# Patient Record
Sex: Female | Born: 1995 | Race: Black or African American | Hispanic: No | Marital: Single | Smoking: Former smoker
Health system: Southern US, Community
[De-identification: ages and names within clinical notes are randomized; demographics above are authoritative.]

## PROBLEM LIST (undated history)

## (undated) ENCOUNTER — Inpatient Hospital Stay (HOSPITAL_COMMUNITY): Payer: Self-pay

## (undated) DIAGNOSIS — Z789 Other specified health status: Secondary | ICD-10-CM

## (undated) HISTORY — PX: DILATION AND CURETTAGE OF UTERUS: SHX78

---
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
## 2015-02-22 ENCOUNTER — Inpatient Hospital Stay (HOSPITAL_COMMUNITY)
Admission: AD | Admit: 2015-02-22 | Discharge: 2015-02-23 | Disposition: A | Discharge status: Home / Self Care | Payer: BLUE CROSS/BLUE SHIELD | Source: Ambulatory Visit | Attending: Family Medicine | Admitting: Family Medicine

## 2015-02-22 DIAGNOSIS — B9689 Other specified bacterial agents as the cause of diseases classified elsewhere: Secondary | ICD-10-CM

## 2015-02-22 DIAGNOSIS — Z87891 Personal history of nicotine dependence: Secondary | ICD-10-CM | POA: Insufficient documentation

## 2015-02-22 DIAGNOSIS — N760 Acute vaginitis: Secondary | ICD-10-CM | POA: Insufficient documentation

## 2015-02-22 DIAGNOSIS — N898 Other specified noninflammatory disorders of vagina: Secondary | ICD-10-CM | POA: Diagnosis present

## 2015-02-22 LAB — POCT PREGNANCY, URINE: PREG TEST UR: NEGATIVE

## 2015-02-22 NOTE — MAU Note (Signed)
Vaginal discharge x1 week that itches. Has an odor as well. LMP at the end of June. Not sexually active.

## 2015-02-23 ENCOUNTER — Encounter (HOSPITAL_COMMUNITY): Payer: Self-pay

## 2015-02-23 DIAGNOSIS — A499 Bacterial infection, unspecified: Secondary | ICD-10-CM

## 2015-02-23 DIAGNOSIS — N760 Acute vaginitis: Secondary | ICD-10-CM

## 2015-02-23 LAB — URINALYSIS, ROUTINE W REFLEX MICROSCOPIC
Bilirubin Urine: NEGATIVE
GLUCOSE, UA: NEGATIVE mg/dL
HGB URINE DIPSTICK: NEGATIVE
Ketones, ur: NEGATIVE mg/dL
LEUKOCYTES UA: NEGATIVE
Nitrite: NEGATIVE
PH: 7.5 (ref 5.0–8.0)
Protein, ur: NEGATIVE mg/dL
Specific Gravity, Urine: 1.015 (ref 1.005–1.030)
Urobilinogen, UA: 0.2 mg/dL (ref 0.0–1.0)

## 2015-02-23 LAB — WET PREP, GENITAL
TRICH WET PREP: NONE SEEN
Yeast Wet Prep HPF POC: NONE SEEN

## 2015-02-23 MED ORDER — FLUCONAZOLE 150 MG PO TABS: 150.0000 mg | ORAL_TABLET | Freq: Every day | ORAL | Status: DC

## 2015-02-23 MED ORDER — METRONIDAZOLE 500 MG PO TABS: 500.0000 mg | ORAL_TABLET | Freq: Two times a day (BID) | ORAL | Status: DC

## 2015-02-23 NOTE — MAU Provider Note (Signed)
History     CSN: [id number]  Arrival date and time: 02/22/15 2312   First Provider Initiated Contact with Patient 02/23/15 0006      No chief complaint on file.  HPI  Ms. Kristina HutchingShante S Sampson is a 19 y.o. G1P0010 who presents to MAU today with complaint of thin, white, malodorous discharge x 2 days. She denies itching, abdominal pain, fever, UTI symptoms or N/V/D today. She is not currently sexually active.    OB History    Gravida Para Term Preterm AB TAB SAB Ectopic Multiple Living   1 0 0 0 1 1 0 0 0 0       Past Medical History  Diagnosis Date  . Medical history non-contributory     Past Surgical History  Procedure Laterality Date  . Dilation and curettage of uterus      History reviewed. No pertinent family history.  History  Substance Use Topics  . Smoking status: Former Games developermoker  . Smokeless tobacco: Never Used  . Alcohol Use: No    Allergies: No Known Allergies  No prescriptions prior to admission    Review of Systems  Constitutional: Negative for fever and malaise/fatigue.  Gastrointestinal: Negative for nausea, vomiting, abdominal pain, diarrhea and constipation.  Genitourinary: Negative for dysuria, urgency and frequency.       + vaginal discharge Neg - vginal bleeding   Physical Exam   Blood pressure 116/81, pulse 64, temperature 98.2 F (36.8 C), temperature source Oral, resp. rate 18, height 5' (1.524 m), weight 113 lb 9.6 oz (51.529 kg), SpO2 100 %.  Physical Exam  Nursing note and vitals reviewed. Constitutional: She is oriented to person, place, and time. She appears well-developed and well-nourished. No distress.  HENT:  Head: Normocephalic and atraumatic.  Cardiovascular: Normal rate.   Respiratory: Effort normal.  GI: Soft. She exhibits no distension and no mass. There is no tenderness. There is no rebound and no guarding.  Genitourinary: Uterus is not enlarged and not tender. Cervix exhibits no motion tenderness, no discharge and no  friability. Right adnexum displays no mass and no tenderness. Left adnexum displays no mass and no tenderness. No bleeding in the vagina. Vaginal discharge (scant thin, white, malodorous discharge noted) found.  Neurological: She is alert and oriented to person, place, and time.  Skin: Skin is warm and dry. No erythema.  Psychiatric: She has a normal mood and affect.    Results for orders placed or performed during the hospital encounter of 02/22/15 (from the past 24 hour(s))  Urinalysis, Routine w reflex microscopic ([facility])     Status: None   Collection Time: 02/22/15 11:33 PM  Result Value Ref Range   Color, Urine YELLOW YELLOW   APPearance CLEAR CLEAR   Specific Gravity, Urine 1.015 1.005 - 1.030   pH 7.5 5.0 - 8.0   Glucose, UA NEGATIVE NEGATIVE mg/dL   Hgb urine dipstick NEGATIVE NEGATIVE   Bilirubin Urine NEGATIVE NEGATIVE   Ketones, ur NEGATIVE NEGATIVE mg/dL   Protein, ur NEGATIVE NEGATIVE mg/dL   Urobilinogen, UA 0.2 0.0 - 1.0 mg/dL   Nitrite NEGATIVE NEGATIVE   Leukocytes, UA NEGATIVE NEGATIVE  Pregnancy, urine POC     Status: None   Collection Time: 02/22/15 11:52 PM  Result Value Ref Range   Preg Test, Ur NEGATIVE NEGATIVE  Wet prep, genital     Status: Abnormal   Collection Time: 02/23/15 12:07 AM  Result Value Ref Range   Yeast Wet Prep HPF POC NONE SEEN  NONE SEEN   Trich, Wet Prep NONE SEEN NONE SEEN   Clue Cells Wet Prep HPF POC FEW (A) NONE SEEN   WBC, Wet Prep HPF POC FEW (A) NONE SEEN    MAU Course  Procedures None  MDM UPT - negative UA, wet prep, GC/Chlamydia Declines HIV, RPR today  Assessment and Plan  A: Bacterial Vaginosis H/O yeast vulvovaginitis following antibiotic therapy  P: Discharge home Rx for Flagyl and Diflucan given to patient Warning signs for worsening condition discussed Patient advised to follow-up with PCP of choice if symptoms persist Patient may return to MAU as needed or if her condition were to change or  worsen   Marny Lowenstein, PA-C  02/23/2015, 1:57 AM

## 2015-02-23 NOTE — Discharge Instructions (Signed)
Bacterial Vaginosis Bacterial vaginosis is a vaginal infection that occurs when the normal balance of bacteria in the vagina is disrupted. It results from an overgrowth of certain bacteria. This is the most common vaginal infection in women of childbearing age. Treatment is important to prevent complications, especially in pregnant women, as it can cause a premature delivery. CAUSES  Bacterial vaginosis is caused by an increase in harmful bacteria that are normally present in smaller amounts in the vagina. Several different kinds of bacteria can cause bacterial vaginosis. However, the reason that the condition develops is not fully understood. RISK FACTORS Certain activities or behaviors can put you at an increased risk of developing bacterial vaginosis, including:  Having a new sex partner or multiple sex partners.  Douching.  Using an intrauterine device (IUD) for contraception. Women do not get bacterial vaginosis from toilet seats, bedding, swimming pools, or contact with objects around them. SIGNS AND SYMPTOMS  Some women with bacterial vaginosis have no signs or symptoms. Common symptoms include:  Grey vaginal discharge.  A fishlike odor with discharge, especially after sexual intercourse.  Itching or burning of the vagina and vulva.  Burning or pain with urination. DIAGNOSIS  Your health care provider will take a medical history and examine the vagina for signs of bacterial vaginosis. A sample of vaginal fluid may be taken. Your health care provider will look at this sample under a microscope to check for bacteria and abnormal cells. A vaginal pH test may also be done.  TREATMENT  Bacterial vaginosis may be treated with antibiotic medicines. These may be given in the form of a pill or a vaginal cream. A second round of antibiotics may be prescribed if the condition comes back after treatment.  HOME CARE INSTRUCTIONS   Only take over-the-counter or prescription medicines as  directed by your health care provider.  If antibiotic medicine was prescribed, take it as directed. Make sure you finish it even if you start to feel better.  Do not have sex until treatment is completed.  Tell all sexual partners that you have a vaginal infection. They should see their health care provider and be treated if they have problems, such as a mild rash or itching.  Practice safe sex by using condoms and only having one sex partner. SEEK MEDICAL CARE IF:   Your symptoms are not improving after 3 days of treatment.  You have increased discharge or pain.  You have a fever. MAKE SURE YOU:   Understand these instructions.  Will watch your condition.  Will get help right away if you are not doing well or get worse. FOR MORE INFORMATION  Centers for Disease Control and Prevention, Division of STD Prevention: www.cdc.gov/std American Sexual Health Association (ASHA): www.ashastd.org  Document Released: 07/23/2005 Document Revised: 05/13/2013 Document Reviewed: 03/04/2013 ExitCare Patient Information 2015 [employer]. This information is not intended to replace advice given to you by your health care provider. Make sure you discuss any questions you have with your health care provider.  

## 2015-02-24 LAB — GC/CHLAMYDIA PROBE AMP (~~LOC~~) NOT AT ARMC
Chlamydia: NEGATIVE
NEISSERIA GONORRHEA: NEGATIVE

## 2016-07-19 ENCOUNTER — Inpatient Hospital Stay (HOSPITAL_COMMUNITY)
Admission: AD | Admit: 2016-07-19 | Discharge: 2016-07-19 | Disposition: A | Discharge status: Home / Self Care | Payer: BLUE CROSS/BLUE SHIELD | Source: Ambulatory Visit | Attending: Obstetrics and Gynecology | Admitting: Obstetrics and Gynecology

## 2016-07-19 ENCOUNTER — Encounter (HOSPITAL_COMMUNITY): Payer: Self-pay | Admitting: *Deleted

## 2016-07-19 DIAGNOSIS — R102 Pelvic and perineal pain: Secondary | ICD-10-CM | POA: Diagnosis not present

## 2016-07-19 DIAGNOSIS — O26892 Other specified pregnancy related conditions, second trimester: Secondary | ICD-10-CM

## 2016-07-19 DIAGNOSIS — O26842 Uterine size-date discrepancy, second trimester: Secondary | ICD-10-CM | POA: Insufficient documentation

## 2016-07-19 LAB — WET PREP, GENITAL
Clue Cells Wet Prep HPF POC: NONE SEEN
SPERM: NONE SEEN
TRICH WET PREP: NONE SEEN
YEAST WET PREP: NONE SEEN

## 2016-07-19 LAB — URINALYSIS, ROUTINE W REFLEX MICROSCOPIC
BILIRUBIN URINE: NEGATIVE
Glucose, UA: NEGATIVE mg/dL
Hgb urine dipstick: NEGATIVE
Ketones, ur: NEGATIVE mg/dL
LEUKOCYTES UA: NEGATIVE
NITRITE: NEGATIVE
PH: 8 (ref 5.0–8.0)
Protein, ur: NEGATIVE mg/dL
SPECIFIC GRAVITY, URINE: 1.012 (ref 1.005–1.030)

## 2016-07-19 NOTE — Discharge Instructions (Signed)
Second Trimester of Pregnancy The second trimester is from week 13 through week 28, month 4 through 6. This is often the time in pregnancy that you feel your best. Often times, morning sickness has lessened or quit. You may have more energy, and you may get hungry more often. Your unborn baby (fetus) is growing rapidly. At the end of the sixth month, he or she is about 9 inches long and weighs about 1 pounds. You will likely feel the baby move (quickening) between 18 and 20 weeks of pregnancy. Follow these instructions at home:  Avoid all smoking, herbs, and alcohol. Avoid drugs not approved by your doctor.  Do not use any tobacco products, including cigarettes, chewing tobacco, and electronic cigarettes. If you need help quitting, ask your doctor. You may get counseling or other support to help you quit.  Only take medicine as told by your doctor. Some medicines are safe and some are not during pregnancy.  Exercise only as told by your doctor. Stop exercising if you start having cramps.  Eat regular, healthy meals.  Wear a good support bra if your breasts are tender.  Do not use hot tubs, steam rooms, or saunas.  Wear your seat belt when driving.  Avoid raw meat, uncooked cheese, and liter boxes and soil used by cats.  Take your prenatal vitamins.  Take 1500-2000 milligrams of calcium daily starting at the 20th week of pregnancy until you deliver your baby.  Try taking medicine that helps you poop (stool softener) as needed, and if your doctor approves. Eat more fiber by eating fresh fruit, vegetables, and whole grains. Drink enough fluids to keep your pee (urine) clear or pale yellow.  Take warm water baths (sitz baths) to soothe pain or discomfort caused by hemorrhoids. Use hemorrhoid cream if your doctor approves.  If you have puffy, bulging veins (varicose veins), wear support hose. Raise (elevate) your feet for 15 minutes, 3-4 times a day. Limit salt in your diet.  Avoid heavy  lifting, wear low heals, and sit up straight.  Rest with your legs raised if you have leg cramps or low back pain.  Visit your dentist if you have not gone during your pregnancy. Use a soft toothbrush to brush your teeth. Be gentle when you floss.  You can have sex (intercourse) unless your doctor tells you not to.  Go to your doctor visits. Get help if:  You feel dizzy.  You have mild cramps or pressure in your lower belly (abdomen).  You have a nagging pain in your belly area.  You continue to feel sick to your stomach (nauseous), throw up (vomit), or have watery poop (diarrhea).  You have bad smelling fluid coming from your vagina.  You have pain with peeing (urination). Get help right away if:  You have a fever.  You are leaking fluid from your vagina.  You have spotting or bleeding from your vagina.  You have severe belly cramping or pain.  You lose or gain weight rapidly.  You have trouble catching your breath and have chest pain.  You notice sudden or extreme puffiness (swelling) of your face, hands, ankles, feet, or legs.  You have not felt the baby move in over an hour.  You have severe headaches that do not go away with medicine.  You have vision changes. This information is not intended to replace advice given to you by your health care provider. Make sure you discuss any questions you have with your health care  provider. Document Released: 10/17/2009 Document Revised: 12/29/2015 Document Reviewed: 09/23/2012 Elsevier Interactive Patient Education  2017 [employer] First Trimester of Pregnancy The first trimester of pregnancy is from week 1 until the end of week 12 (months 1 through 3). During this time, your baby will begin to develop inside you. At 6-8 weeks, the eyes and face are formed, and the heartbeat can be seen on ultrasound. At the end of 12 weeks, all the baby's organs are formed. Prenatal care is all the medical care you receive before the  birth of your baby. Make sure you get good prenatal care and follow all of your doctor's instructions. Follow these instructions at home: Medicines  Take medicine only as told by your doctor. Some medicines are safe and some are not during pregnancy.  Take your prenatal vitamins as told by your doctor.  Take medicine that helps you poop (stool softener) as needed if your doctor says it is okay. Diet  Eat regular, healthy meals.  Your doctor will tell you the amount of weight gain that is right for you.  Avoid raw meat and uncooked cheese.  If you feel sick to your stomach (nauseous) or throw up (vomit):  Eat 4 or 5 small meals a day instead of 3 large meals.  Try eating a few soda crackers.  Drink liquids between meals instead of during meals.  If you have a hard time pooping (constipation):  Eat high-fiber foods like fresh vegetables, fruit, and whole grains.  Drink enough fluids to keep your pee (urine) clear or pale yellow. Activity and Exercise  Exercise only as told by your doctor. Stop exercising if you have cramps or pain in your lower belly (abdomen) or low back.  Try to avoid standing for long periods of time. Move your legs often if you must stand in one place for a long time.  Avoid heavy lifting.  Wear low-heeled shoes. Sit and stand up straight.  You can have sex unless your doctor tells you not to. Relief of Pain or Discomfort  Wear a good support bra if your breasts are sore.  Take warm water baths (sitz baths) to soothe pain or discomfort caused by hemorrhoids. Use hemorrhoid cream if your doctor says it is okay.  Rest with your legs raised if you have leg cramps or low back pain.  Wear support hose if you have puffy, bulging veins (varicose veins) in your legs. Raise (elevate) your feet for 15 minutes, 3-4 times a day. Limit salt in your diet. Prenatal Care  Schedule your prenatal visits by the twelfth week of pregnancy.  Write down your  questions. Take them to your prenatal visits.  Keep all your prenatal visits as told by your doctor. Safety  Wear your seat belt at all times when driving.  Make a list of emergency phone numbers. The list should include numbers for family, friends, the hospital, and police and fire departments. General Tips  Ask your doctor for a referral to a local prenatal class. Begin classes no later than at the start of month 6 of your pregnancy.  Ask for help if you need counseling or help with nutrition. Your doctor can give you advice or tell you where to go for help.  Do not use hot tubs, steam rooms, or saunas.  Do not douche or use tampons or scented sanitary pads.  Do not cross your legs for long periods of time.  Avoid litter boxes and soil used by cats.  Avoid  all smoking, herbs, and alcohol. Avoid drugs not approved by your doctor.  Do not use any tobacco products, including cigarettes, chewing tobacco, and electronic cigarettes. If you need help quitting, ask your doctor. You may get counseling or other support to help you quit.  Visit your dentist. At home, brush your teeth with a soft toothbrush. Be gentle when you floss. Get help if:  You are dizzy.  You have mild cramps or pressure in your lower belly.  You have a nagging pain in your belly area.  You continue to feel sick to your stomach, throw up, or have watery poop (diarrhea).  You have a bad smelling fluid coming from your vagina.  You have pain with peeing (urination).  You have increased puffiness (swelling) in your face, hands, legs, or ankles. Get help right away if:  You have a fever.  You are leaking fluid from your vagina.  You have spotting or bleeding from your vagina.  You have very bad belly cramping or pain.  You gain or lose weight rapidly.  You throw up blood. It may look like coffee grounds.  You are around people who have [place] measles, fifth disease, or chickenpox.  You have a very  bad headache.  You have shortness of breath.  You have any kind of trauma, such as from a fall or a car accident. This information is not intended to replace advice given to you by your health care provider. Make sure you discuss any questions you have with your health care provider. Document Released: 01/09/2008 Document Revised: 12/29/2015 Document Reviewed: 06/02/2013 Elsevier Interactive Patient Education  2017 [employer] Prenatal Care Providers [facility]    [facility]  [phone]- [phone]     Phone: [phone]          [facility]                      [facility] [place]  @[place]     Phone: [phone]  Phone: [phone]         [facility]     Phone: [phone]  Phone: [phone]           [facility] [place]                hone: [phone]  Phone: [phone]         [facility] [address]      Phone: [phone]  Phone: [phone]         [facility]                Phone: [phone]  [facility]   [address]Phone:[phone]    [facility] [place]          Phone: [phone] [facility]   Phone: [phone]

## 2016-07-19 NOTE — MAU Note (Signed)
Pt reports having abd cramping on and off x 1 week. Back pain started today. [place] now.

## 2016-07-19 NOTE — MAU Provider Note (Signed)
Chief Complaint: Abdominal Pain   First Provider Initiated Contact with Patient 07/19/16 1740        SUBJECTIVE HPI: Kristina Sampson is a 20 y.o. G2P0010 at 1074w4d by LMP who presents to maternity admissions reporting pelvic cramping for a week. Also has some low back pain. Moved here recently. She denies vaginal bleeding, vaginal itching/burning, urinary symptoms, h/a, dizziness, n/v, or fever/chills.   Abdominal Pain  This is a new problem. The current episode started in the past 7 days. The onset quality is gradual. The problem occurs intermittently. The problem has been waxing and waning. The pain is located in the LLQ and RLQ. The pain is mild. The quality of the pain is cramping. The abdominal pain does not radiate. Pertinent negatives include no constipation, diarrhea, dysuria, fever, hematuria, myalgias, nausea or vomiting. Nothing aggravates the pain. The pain is relieved by nothing. She has tried nothing for the symptoms.   RN Note: Pt reports having abd cramping on and off x 1 week. Back pain started today. [place] in [place] now  Past Medical History:  Diagnosis Date  . Medical history non-contributory    Past Surgical History:  Procedure Laterality Date  . DILATION AND CURETTAGE OF UTERUS     Social History   Social History  . Marital status: Single    Spouse name: N/A  . Number of children: N/A  . Years of education: N/A   Occupational History  . Not on file.   Social History Main Topics  . Smoking status: Former Games developermoker  . Smokeless tobacco: Never Used  . Alcohol use No  . Drug use:     Types: Marijuana  . Sexual activity: Not Currently    Birth control/ protection: None   Other Topics Concern  . Not on file   Social History Narrative  . No narrative on file   No current facility-administered medications on file prior to encounter.    Current Outpatient Prescriptions on File Prior to Encounter  Medication Sig  Dispense Refill  . fluconazole (DIFLUCAN) 150 MG tablet Take 1 tablet (150 mg total) by mouth daily. (Patient not taking: Reported on 07/19/2016) 1 tablet 0  . metroNIDAZOLE (FLAGYL) 500 MG tablet Take 1 tablet (500 mg total) by mouth 2 (two) times daily. (Patient not taking: Reported on 07/19/2016) 14 tablet 0   No Known Allergies  I have reviewed patient's Past Medical Hx, Surgical Hx, Family Hx, Social Hx, medications and allergies.   ROS:  Review of Systems  Constitutional: Negative for fever.  Gastrointestinal: Positive for abdominal pain. Negative for constipation, diarrhea, nausea and vomiting.  Genitourinary: Negative for dysuria and hematuria.  Musculoskeletal: Negative for myalgias.   Review of Systems  Other systems negative   Physical Exam  Physical Exam Patient Vitals for the past 24 hrs:  BP Temp Temp src Pulse Resp Height Weight  07/19/16 1733 98/61 98.8 F (37.1 C) Oral 68 18 5\' 1"  (1.549 m) 115 lb 6.4 oz (52.3 kg)   Constitutional: Well-developed, well-nourished female in no acute distress.  Cardiovascular: normal rate Respiratory: normal effort GI: Abd soft, non-tender. Pos BS x 4 MS: Extremities nontender, no edema, normal ROM Neurologic: Alert and oriented x 4.  GU: Neg CVAT.  PELVIC EXAM: Cervix pink, visually closed, without lesion, scant white creamy discharge, vaginal walls and external genitalia normal  Bimanual exam: Cervix 0/long/high, firm, anterior, neg CMT, uterus nontender, slightly enlarged, smaller than expected for 16 weeks,  approximately 10-12wks size, adnexa without tenderness, enlargement, or mass  HR 152 by doppler   LAB RESULTS Results for orders placed or performed during the hospital encounter of 07/19/16 (from the past 24 hour(s))  Urinalysis, Routine w reflex microscopic     Status: None   Collection Time: 07/19/16  5:36 PM  Result Value Ref Range   Color, Urine YELLOW YELLOW   APPearance CLEAR CLEAR   Specific Gravity, Urine  1.012 1.005 - 1.030   pH 8.0 5.0 - 8.0   Glucose, UA NEGATIVE NEGATIVE mg/dL   Hgb urine dipstick NEGATIVE NEGATIVE   Bilirubin Urine NEGATIVE NEGATIVE   Ketones, ur NEGATIVE NEGATIVE mg/dL   Protein, ur NEGATIVE NEGATIVE mg/dL   Nitrite NEGATIVE NEGATIVE   Leukocytes, UA NEGATIVE NEGATIVE  Wet prep, genital     Status: Abnormal   Collection Time: 07/19/16  5:56 PM  Result Value Ref Range   Yeast Wet Prep HPF POC NONE SEEN NONE SEEN   Trich, Wet Prep NONE SEEN NONE SEEN   Clue Cells Wet Prep HPF POC NONE SEEN NONE SEEN   WBC, Wet Prep HPF POC FEW (A) NONE SEEN   Sperm NONE SEEN        IMAGING No results found.  MAU Management/MDM: Discussed may need US for dating next week, ordered Good FHR audible now Cultures and wet prep done. List of OB providers given  ASSESSMENT Pelvic pain affecting pregnancy in second trimester, antepartum  Size of fetus inconsistent with dates in second trimester - Plan: US OB Limited SIUP at 1885w3d by LMP  PLAN Discharge home US ordered for outpatient status this week to confirm dates List of providers given  Pt stable at time of discharge. Encouraged to return here or to other Urgent Care/ED if she develops worsening of symptoms, increase in pain, fever, or other concerning symptoms.    Wynelle BourgeoisMarie Misti Towle CNM, MSN Certified Nurse-Midwife 07/19/2016  7:06 PM

## 2016-07-20 LAB — GC/CHLAMYDIA PROBE AMP (~~LOC~~) NOT AT ARMC
Chlamydia: NEGATIVE
Neisseria Gonorrhea: NEGATIVE

## 2016-07-23 ENCOUNTER — Telehealth: Payer: Self-pay | Admitting: *Deleted

## 2016-07-23 NOTE — Telephone Encounter (Signed)
Called pt and left message stating that I am calling with non urgent test results. Please call back and leave a message stating whether a detailed message can be left on your voice mail.  Pt needs to be informed that her GC/CT test was negative.

## 2016-07-24 NOTE — Telephone Encounter (Signed)
Called patient and left message that results are normal and if she has further questions to call us back.

## 2016-07-24 NOTE — Telephone Encounter (Signed)
Kristina Sampson called back yesterday afternoon and left a voicemail that it is ok to leave test results on her voicemail, she verified her name, birth date.

## 2016-08-06 NOTE — L&D Delivery Note (Signed)
Operative Delivery Note Patient pushed for less than 30 minutes after she was noted to be C/C/+2.  There was fetal bradycardia nadir 50's   Discussed with patient operative delivery with a vacuum. Verbal consent: obtained from patient. Risks and benefits discussed in detail.  Risks include, but are not limited to the risks of anesthesia, bleeding, infection, damage to maternal tissues, fetal cephalhematoma.  There is also the risk of inability to effect vaginal delivery of the head, or shoulder dystocia that cannot be resolved by established maneuvers, leading to the need for emergency cesarean section.  Head determined to be direct OA. Bladder was empty as foley catheter had just been removed. Epidural was found to be adequate.  Vertex was +3 station.    The Kiwi vacuum was placed.  With maternal effort and 3 pulls,  At 5:27 AM a viable and healthy female was delivered via Vaginal, Vacuum Investment banker, operational(Extractor).  Presentation: vertex; Position: Occiput,, Anterior; Station: +3.houlders and body were easily delivered. Cord was double clamped and cut and the infant handed to the waiting Pediatricians.  Infant was noted to be moving all four extremities and crying vigorously. Cord gases and cord blood was obtained. The placenta delivered spontaneously intact 3 vessels noted.     Uterine atony was present and with IV pitocin, and massage. The second degree perineal laceration was repaired in routine fashion with 2-0 Vicryl. The patient tolerated procedure well.   APGAR: 8, 9; weight  pending   Placenta status: intact  Cord:  3 vessels with the following complications: none  Cord pH: pending  Anesthesia:  Epidural Instruments: Kiwi vacuum Episiotomy: None Lacerations: 2nd degree Suture Repair: 2.0 vicryl Est. Blood Loss (mL):  400 mL  Mom to postpartum.  Baby to Couplet care / Skin to Skin.  Essie HartINN, Caris Cerveny STACIA 01/17/2017, 5:52 AM

## 2016-08-16 LAB — OB RESULTS CONSOLE HEPATITIS B SURFACE ANTIGEN: Hepatitis B Surface Ag: NEGATIVE

## 2016-08-17 LAB — OB RESULTS CONSOLE HIV ANTIBODY (ROUTINE TESTING): HIV: NONREACTIVE

## 2016-08-17 LAB — OB RESULTS CONSOLE RUBELLA ANTIBODY, IGM: RUBELLA: IMMUNE

## 2016-12-03 ENCOUNTER — Inpatient Hospital Stay (HOSPITAL_COMMUNITY)
Admission: AD | Admit: 2016-12-03 | Discharge: 2016-12-04 | Disposition: A | Discharge status: Home / Self Care | Payer: Medicaid Other | Source: Ambulatory Visit | Attending: Obstetrics & Gynecology | Admitting: Obstetrics & Gynecology

## 2016-12-03 ENCOUNTER — Encounter (HOSPITAL_COMMUNITY): Payer: Self-pay | Admitting: *Deleted

## 2016-12-03 DIAGNOSIS — O26893 Other specified pregnancy related conditions, third trimester: Secondary | ICD-10-CM | POA: Diagnosis not present

## 2016-12-03 DIAGNOSIS — Z87891 Personal history of nicotine dependence: Secondary | ICD-10-CM | POA: Diagnosis not present

## 2016-12-03 DIAGNOSIS — Z9889 Other specified postprocedural states: Secondary | ICD-10-CM | POA: Diagnosis not present

## 2016-12-03 DIAGNOSIS — R109 Unspecified abdominal pain: Secondary | ICD-10-CM | POA: Diagnosis present

## 2016-12-03 DIAGNOSIS — M545 Low back pain, unspecified: Secondary | ICD-10-CM

## 2016-12-03 DIAGNOSIS — Z91018 Allergy to other foods: Secondary | ICD-10-CM | POA: Diagnosis not present

## 2016-12-03 DIAGNOSIS — O4703 False labor before 37 completed weeks of gestation, third trimester: Secondary | ICD-10-CM | POA: Diagnosis not present

## 2016-12-03 DIAGNOSIS — Z3A32 32 weeks gestation of pregnancy: Secondary | ICD-10-CM | POA: Insufficient documentation

## 2016-12-03 LAB — URINALYSIS, ROUTINE W REFLEX MICROSCOPIC
Bilirubin Urine: NEGATIVE
GLUCOSE, UA: NEGATIVE mg/dL
HGB URINE DIPSTICK: NEGATIVE
KETONES UR: 5 mg/dL — AB
LEUKOCYTES UA: NEGATIVE
Nitrite: NEGATIVE
PROTEIN: NEGATIVE mg/dL
Specific Gravity, Urine: 1.012 (ref 1.005–1.030)
pH: 5 (ref 5.0–8.0)

## 2016-12-03 MED ORDER — ACETAMINOPHEN 500 MG PO TABS
1000.0000 mg | ORAL_TABLET | Freq: Once | ORAL | Status: AC
  Administered 2016-12-03: 1000 mg via ORAL
  Filled 2016-12-03: qty 2

## 2016-12-03 NOTE — MAU Provider Note (Signed)
History     CSN: [id number]  Arrival date and time: 12/03/16 2217  First Provider Initiated Contact with Patient 12/03/16 2318      Chief Complaint  Patient presents with  . Contractions   HPI LEAHANNA BUSER is a 21 y.o. G2P0010 at [redacted]w[redacted]d who presents with contractions & back pain. Symptoms began today around 2 pm. Reports feeling abdominal pain that was intermittent & occurred every 10 minutes. Pain started while at work & resolved prior to arriving in MAU.  Also reports low back pain that is constant & started at same time. Patient states she works at [employer] stands all day during her shift. Does not wear maternity support belt. Rates pain 4/10. Has not treated. Pain worse with walking, twisting movement, & bending over. Denies vaginal bleeding, LOF, dysuria, n/v/d, constipation. Positive fetal movement.   OB History    Gravida Para Term Preterm AB Living   2 0 0 0 1 0   SAB TAB Ectopic Multiple Live Births   0 1 0 0        Past Medical History:  Diagnosis Date  . Medical history non-contributory     Past Surgical History:  Procedure Laterality Date  . DILATION AND CURETTAGE OF UTERUS      History reviewed. No pertinent family history.  Social History  Substance Use Topics  . Smoking status: Former Smoker    Quit date: 06/04/2016  . Smokeless tobacco: Never Used  . Alcohol use No    Allergies:  Allergies  Allergen Reactions  . Lac Bovis     Milk Other reaction(s): Angioedema Itchy throat  . Other     Kiwi Other reaction(s): Angioedema    Prescriptions Prior to Admission  Medication Sig Dispense Refill Last Dose  . Prenatal Vit-Fe Fumarate-FA (PRENATAL MULTIVITAMIN) TABS tablet Take 1 tablet by mouth daily at 12 noon.   12/02/2016 at Unknown time    Review of Systems  Constitutional: Negative.   Gastrointestinal: Negative.   Genitourinary: Negative.   Musculoskeletal: Positive for back pain.   Physical Exam   Blood pressure (!) 106/59, pulse  75, temperature 98.8 F (37.1 C), temperature source Oral, resp. rate 16, height  (1.575 m), weight 136 lb (61.7 kg), last menstrual period 03/25/2016, SpO2 100 %.  Physical Exam  Nursing note and vitals reviewed. Constitutional: She is oriented to person, place, and time. She appears well-developed and well-nourished. No distress.  HENT:  Head: Normocephalic and atraumatic.  Eyes: Conjunctivae are normal. Right eye exhibits no discharge. Left eye exhibits no discharge. No scleral icterus.  Neck: Normal range of motion.  Cardiovascular: Normal rate, regular rhythm and normal heart sounds.   No murmur heard. Respiratory: Effort normal and breath sounds normal. No respiratory distress. She has no wheezes.  GI: Soft. Bowel sounds are normal. There is no tenderness. There is no CVA tenderness.  Neurological: She is alert and oriented to person, place, and time.  Skin: Skin is warm and dry. She is not diaphoretic.  Psychiatric: She has a normal mood and affect. Her behavior is normal. Judgment and thought content normal.   Dilation: Closed Effacement (%): Thick Cervical Position: Posterior Exam by:: Judeth Horn NP  Fetal Tracing:  Baseline: 135 Variability: moderate Accelerations: 15x15 Decelerations: none  Toco: resolved w/PO hydration MAU Course  Procedures Results for orders placed or performed during the hospital encounter of 12/03/16 (from the past 24 hour(s))  Urinalysis, Routine w reflex microscopic     Status:  Abnormal   Collection Time: 12/03/16 10:28 PM  Result Value Ref Range   Color, Urine YELLOW YELLOW   APPearance HAZY (A) CLEAR   Specific Gravity, Urine 1.012 1.005 - 1.030   pH 5.0 5.0 - 8.0   Glucose, UA NEGATIVE NEGATIVE mg/dL   Hgb urine dipstick NEGATIVE NEGATIVE   Bilirubin Urine NEGATIVE NEGATIVE   Ketones, ur 5 (A) NEGATIVE mg/dL   Protein, ur NEGATIVE NEGATIVE mg/dL   Nitrite NEGATIVE NEGATIVE   Leukocytes, UA NEGATIVE NEGATIVE     MDM Reactive fetal tracing Irr ctx that resolved after PO hydration Cervix closed/thick Tylenol & warm compress to back S/w Dr. Mora Appl. Ok to discharge home with PTL precautions Assessment and Plan  A: 1. Preterm uterine contractions in third trimester, antepartum   2. Low back pain during pregnancy in third trimester    P: Discharge home Rx maternity support belt -- to wear at work Discussed used of tylenol & heat therapy for back pain Discussed reasons to return to MAU & PTL precautions Keep f/u with OB  Judeth Horn 12/03/2016, 11:18 PM

## 2016-12-03 NOTE — MAU Note (Signed)
Pt c/o lower back pain and lower abdominal cramping. She has had this pain before but tonight "it feels different." She does not have the pain now, but states on the way here she was able to sit and rest and that helped. +FM. Pt denies vag bleeding.

## 2016-12-04 DIAGNOSIS — O4703 False labor before 37 completed weeks of gestation, third trimester: Secondary | ICD-10-CM

## 2016-12-04 MED ORDER — COMFORT FIT MATERNITY SUPP SM MISC: 1.0000 [IU] | Freq: Every day | 0 refills | Status: DC | PRN

## 2016-12-04 NOTE — Discharge Instructions (Signed)
Back Pain in Pregnancy °Back pain during pregnancy is common. Back pain may be caused by several factors that are related to changes during your pregnancy. °Follow these instructions at home: °Managing pain, stiffness, and swelling  °· If directed, apply ice for sudden (acute) back pain. °¨ Put ice in a plastic bag. °¨ Place a towel between your skin and the bag. °¨ Leave the ice on for 20 minutes, 2-3 times per day. °· If directed, apply heat to the affected area before you exercise: °¨ Place a towel between your skin and the heat pack or heating pad. °¨ Leave the heat on for 20-30 minutes. °¨ Remove the heat if your skin turns bright red. This is especially important if you are unable to feel pain, heat, or cold. You may have a greater risk of getting burned. °Activity  °· Exercise as told by your health care provider. Exercising is the best way to prevent or manage back pain. °· Listen to your body when lifting. If lifting hurts, ask for help or bend your knees. This uses your leg muscles instead of your back muscles. °· Squat down when picking up something from the floor. Do not bend over. °· Only use bed rest as told by your health care provider. Bed rest should only be used for the most severe episodes of back pain. °Standing, Sitting, and Lying Down  °· Do not stand in one place for long periods of time. °· Use good posture when sitting. Make sure your head rests over your shoulders and is not hanging forward. Use a pillow on your lower back if necessary. °· Try sleeping on your side, preferably the left side, with a pillow or two between your legs. If you are sore after a night's rest, your bed may be too soft. A firm mattress may provide more support for your back during pregnancy. °General instructions  °· Do not wear high heels. °· Eat a healthy diet. Try to gain weight within your health care provider's recommendations. °· Use a maternity girdle, elastic sling, or back brace as told by your health care  provider. °· Take over-the-counter and prescription medicines only as told by your health care provider. °· Keep all follow-up visits as told by your health care provider. This is important. This includes any visits with any specialists, such as a physical therapist. °Contact a health care provider if: °· Your back pain interferes with your daily activities. °· You have increasing pain in other parts of your body. °Get help right away if: °· You develop numbness, tingling, weakness, or problems with the use of your arms or legs. °· You develop severe back pain that is not controlled with medicine. °· You have a sudden change in bowel or bladder control. °· You develop shortness of breath, dizziness, or you faint. °· You develop nausea, vomiting, or sweating. °· You have back pain that is a rhythmic, cramping pain similar to labor pains. Labor pain is usually 1-2 minutes apart, lasts for about 1 minute, and involves a bearing down feeling or pressure in your pelvis. °· You have back pain and your water breaks or you have vaginal bleeding. °· You have back pain or numbness that travels down your leg. °· Your back pain developed after you fell. °· You develop pain on one side of your back. °· You see blood in your urine. °· You develop skin blisters in the area of your back pain. °This information is not intended to replace   advice given to you by your health care provider. Make sure you discuss any questions you have with your health care provider. Document Released: 10/31/2005 Document Revised: 12/29/2015 Document Reviewed: 04/06/2015 Elsevier Interactive Patient Education  2017 [employer]. Preterm Labor and Birth Information The normal length of a pregnancy is 39-41 weeks. Preterm labor is when labor starts before 37 completed weeks of pregnancy. What are the risk factors for preterm labor? Preterm labor is more likely to occur in women who:  Have certain infections during pregnancy such as a bladder  infection, sexually transmitted infection, or infection inside the uterus (chorioamnionitis).  Have a shorter-than-normal cervix.  Have gone into preterm labor before.  Have had surgery on their cervix.  Are younger than age 36 or older than age 37.  Are African American.  Are pregnant with twins or multiple babies (multiple gestation).  Take street drugs or smoke while pregnant.  Do not gain enough weight while pregnant.  Became pregnant shortly after having been pregnant. What are the symptoms of preterm labor? Symptoms of preterm labor include:  Cramps similar to those that can happen during a menstrual period. The cramps may happen with diarrhea.  Pain in the abdomen or lower back.  Regular uterine contractions that may feel like tightening of the abdomen.  A feeling of increased pressure in the pelvis.  Increased watery or bloody mucus discharge from the vagina.  Water breaking (ruptured amniotic sac). Why is it important to recognize signs of preterm labor? It is important to recognize signs of preterm labor because babies who are born prematurely may not be fully developed. This can put them at an increased risk for:  Long-term (chronic) heart and lung problems.  Difficulty immediately after birth with regulating body systems, including blood sugar, body temperature, heart rate, and breathing rate.  Bleeding in the brain.  Cerebral palsy.  Learning difficulties.  Death. These risks are highest for babies who are born before 34 weeks of pregnancy. How is preterm labor treated? Treatment depends on the length of your pregnancy, your condition, and the health of your baby. It may involve:  Having a stitch (suture) placed in your cervix to prevent your cervix from opening too early (cerclage).  Taking or being given medicines, such as:  Hormone medicines. These may be given early in pregnancy to help support the pregnancy.  Medicine to stop  contractions.  Medicines to help mature the babys lungs. These may be prescribed if the risk of delivery is high.  Medicines to prevent your baby from developing cerebral palsy. If the labor happens before 34 weeks of pregnancy, you may need to stay in the hospital. What should I do if I think I am in preterm labor? If you think that you are going into preterm labor, call your health care provider right away. How can I prevent preterm labor in future pregnancies? To increase your chance of having a full-term pregnancy:  Do not use any tobacco products, such as cigarettes, chewing tobacco, and e-cigarettes. If you need help quitting, ask your health care provider.  Do not use street drugs or medicines that have not been prescribed to you during your pregnancy.  Talk with your health care provider before taking any herbal supplements, even if you have been taking them regularly.  Make sure you gain a healthy amount of weight during your pregnancy.  Watch for infection. If you think that you might have an infection, get it checked right away.  Make sure to  tell your health care provider if you have gone into preterm labor before. This information is not intended to replace advice given to you by your health care provider. Make sure you discuss any questions you have with your health care provider. Document Released: 10/13/2003 Document Revised: 01/03/2016 Document Reviewed: 12/14/2015 Elsevier Interactive Patient Education  2017 [employer].

## 2016-12-13 ENCOUNTER — Encounter (HOSPITAL_COMMUNITY): Payer: Self-pay | Admitting: *Deleted

## 2016-12-13 ENCOUNTER — Inpatient Hospital Stay (HOSPITAL_COMMUNITY)
Admission: AD | Admit: 2016-12-13 | Discharge: 2016-12-13 | Disposition: A | Discharge status: Home / Self Care | Payer: Medicaid Other | Source: Ambulatory Visit | Attending: Obstetrics | Admitting: Obstetrics

## 2016-12-13 DIAGNOSIS — Z79899 Other long term (current) drug therapy: Secondary | ICD-10-CM | POA: Diagnosis not present

## 2016-12-13 DIAGNOSIS — O26893 Other specified pregnancy related conditions, third trimester: Secondary | ICD-10-CM | POA: Diagnosis not present

## 2016-12-13 DIAGNOSIS — Z87891 Personal history of nicotine dependence: Secondary | ICD-10-CM | POA: Insufficient documentation

## 2016-12-13 DIAGNOSIS — N9089 Other specified noninflammatory disorders of vulva and perineum: Secondary | ICD-10-CM | POA: Diagnosis not present

## 2016-12-13 DIAGNOSIS — O4703 False labor before 37 completed weeks of gestation, third trimester: Secondary | ICD-10-CM | POA: Diagnosis not present

## 2016-12-13 DIAGNOSIS — Z3A33 33 weeks gestation of pregnancy: Secondary | ICD-10-CM | POA: Insufficient documentation

## 2016-12-13 DIAGNOSIS — O479 False labor, unspecified: Secondary | ICD-10-CM

## 2016-12-13 DIAGNOSIS — R102 Pelvic and perineal pain: Secondary | ICD-10-CM | POA: Insufficient documentation

## 2016-12-13 LAB — WET PREP, GENITAL
Clue Cells Wet Prep HPF POC: NONE SEEN
Sperm: NONE SEEN
Trich, Wet Prep: NONE SEEN
Yeast Wet Prep HPF POC: NONE SEEN

## 2016-12-13 LAB — URINALYSIS, ROUTINE W REFLEX MICROSCOPIC
BILIRUBIN URINE: NEGATIVE
GLUCOSE, UA: NEGATIVE mg/dL
HGB URINE DIPSTICK: NEGATIVE
KETONES UR: NEGATIVE mg/dL
LEUKOCYTES UA: NEGATIVE
NITRITE: NEGATIVE
PH: 6 (ref 5.0–8.0)
Protein, ur: NEGATIVE mg/dL
Specific Gravity, Urine: 1.006 (ref 1.005–1.030)

## 2016-12-13 NOTE — Discharge Instructions (Signed)

## 2016-12-13 NOTE — MAU Provider Note (Signed)
History     CSN: [id number]  Arrival date and time: 12/13/16 2028   First Provider Initiated Contact with Patient 12/13/16 2243      Chief Complaint  Patient presents with  . Pelvic Pain   Kristina Sampson is a 21 y.o. G2P0010 at 645w5d who presents today with vulvar swelling. She states that she had this problem yesterday, but it seems better today. She has pictures from yesterday to show me today. She denies any VB or vaginal discharge. She denies leaking of fluid. She has also noticed some contractions today. She has not timed them. She reports normal fetal movement. Next appointment 12/19/17.    Pelvic Pain  The patient's primary symptoms include pelvic pain. The patient's pertinent negatives include no vaginal discharge. This is a new problem. The current episode started today. The problem occurs intermittently. The problem has been unchanged. Pain severity now: 6/10. The problem affects both sides. She is pregnant. Pertinent negatives include no abdominal pain, chills, dysuria, fever, frequency, nausea, urgency or vomiting. The vaginal discharge was normal. There has been no bleeding. Nothing aggravates the symptoms. She has tried nothing for the symptoms. Sexual activity: No intercourse in the last 24 hours.     Past Medical History:  Diagnosis Date  . Medical history non-contributory     Past Surgical History:  Procedure Laterality Date  . DILATION AND CURETTAGE OF UTERUS      No family history on file.  Social History  Substance Use Topics  . Smoking status: Former Smoker    Quit date: 06/04/2016  . Smokeless tobacco: Never Used  . Alcohol use No    Allergies:  Allergies  Allergen Reactions  . Lac Bovis     Milk Other reaction(s): Angioedema Itchy throat  . Other     Kiwi Other reaction(s): Angioedema    Prescriptions Prior to Admission  Medication Sig Dispense Refill Last Dose  . acetaminophen (TYLENOL) 325 MG tablet Take 650 mg by mouth every 6 (six)  hours as needed for mild pain.   12/13/2016 at Unknown time  . Prenatal Vit-Fe Fumarate-FA (PRENATAL MULTIVITAMIN) TABS tablet Take 1 tablet by mouth daily at 12 noon.   12/13/2016 at Unknown time  . Elastic Bandages & Supports (COMFORT FIT MATERNITY SUPP SM) MISC 1 Units by Does not apply route daily as needed. 1 each 0     Review of Systems  Constitutional: Negative for chills and fever.  Gastrointestinal: Negative for abdominal pain, nausea and vomiting.  Genitourinary: Positive for pelvic pain. Negative for dysuria, frequency, urgency, vaginal bleeding, vaginal discharge and vaginal pain.   Physical Exam   Blood pressure (!) 98/58, pulse 70, temperature 98.1 F (36.7 C), temperature source Oral, resp. rate 18, height 5\' 2"  (1.575 m), weight 139 lb 4 oz (63.2 kg), last menstrual period 03/25/2016.  Physical Exam  Nursing note and vitals reviewed. Constitutional: She is oriented to person, place, and time. She appears well-developed and well-nourished. No distress.  HENT:  Head: Normocephalic.  Cardiovascular: Normal rate.   Respiratory: Effort normal.  GI: Soft. There is no tenderness. There is no rebound.  Genitourinary:  Genitourinary Comments: No vulvar edema noted today. No vulvar varicosities.  Cervix: closed/thick   Neurological: She is alert and oriented to person, place, and time.  Skin: Skin is warm and dry.  Psychiatric: She has a normal mood and affect.   FHT: 135, moderate with 15x15 accels, no decels Toco: Some UI, and about 3-4 contractions in >1 hour.  Results for orders placed or performed during the hospital encounter of 12/13/16 (from the past 24 hour(s))  Urinalysis, Routine w reflex microscopic     Status: Abnormal   Collection Time: 12/13/16  8:48 PM  Result Value Ref Range   Color, Urine STRAW (A) YELLOW   APPearance CLEAR CLEAR   Specific Gravity, Urine 1.006 1.005 - 1.030   pH 6.0 5.0 - 8.0   Glucose, UA NEGATIVE NEGATIVE mg/dL   Hgb urine dipstick  NEGATIVE NEGATIVE   Bilirubin Urine NEGATIVE NEGATIVE   Ketones, ur NEGATIVE NEGATIVE mg/dL   Protein, ur NEGATIVE NEGATIVE mg/dL   Nitrite NEGATIVE NEGATIVE   Leukocytes, UA NEGATIVE NEGATIVE   RBC / HPF 0-5 0 - 5 RBC/hpf   WBC, UA 0-5 0 - 5 WBC/hpf   Bacteria, UA RARE (A) NONE SEEN   Squamous Epithelial / LPF 0-5 (A) NONE SEEN   Mucous PRESENT     MAU Course  Procedures  MDM 2258: D/W Dr. Chestine Spore ok for DC home.   Assessment and Plan   1. Braxton Hicks contractions   2. Vulvar edema   3. [redacted] weeks gestation of pregnancy    DC home Comfort measures reviewed  3rd Trimester precautions  PTL precautions  Fetal kick counts RX: none  Return to MAU as needed FU with OB as planned  Follow-up Information    Levi Aland, MD Follow up.   Specialty:  Obstetrics and Gynecology Contact information: [address] [place] [address] [phone]            Thressa Sheller 12/13/2016, 10:45 PM

## 2016-12-13 NOTE — MAU Note (Signed)
RN to bedside to put patient back on FHR monitor after using restroom.  Patient and family had left AMA.  Thressa ShellerHeather Hogan, CNM notified.

## 2016-12-13 NOTE — MAU Note (Signed)
PT SAYS HER VAGINA FEELS HEAVY  AND FEELS SWOLLEN  - NL D/C.   NO PAIN.      DR Dareen PianoANDERSON- PRIMARY.   DID  NOT  CALL  OFFICE    FEELS  CONTRACTIONS  AT   WORK.    LAST SEX-   07-2016.

## 2016-12-14 LAB — GC/CHLAMYDIA PROBE AMP (~~LOC~~) NOT AT ARMC
Chlamydia: NEGATIVE
Neisseria Gonorrhea: NEGATIVE

## 2016-12-14 LAB — OB RESULTS CONSOLE GC/CHLAMYDIA: Gonorrhea: NEGATIVE

## 2016-12-15 LAB — CULTURE, OB URINE

## 2016-12-16 ENCOUNTER — Telehealth: Payer: Self-pay | Admitting: Obstetrics and Gynecology

## 2016-12-16 NOTE — Telephone Encounter (Signed)
TC to patient re: yeast on urine culture. Seen MAU for vulvar edema. Denies vag/vulvar itch or irritation. Consider treatment at visit 12/18/16.

## 2016-12-19 ENCOUNTER — Other Ambulatory Visit: Payer: Self-pay | Admitting: Obstetrics & Gynecology

## 2016-12-28 ENCOUNTER — Other Ambulatory Visit: Payer: Self-pay | Admitting: Obstetrics and Gynecology

## 2016-12-28 LAB — OB RESULTS CONSOLE GBS: STREP GROUP B AG: NEGATIVE

## 2017-01-10 ENCOUNTER — Encounter (HOSPITAL_COMMUNITY): Payer: Self-pay | Admitting: *Deleted

## 2017-01-10 ENCOUNTER — Inpatient Hospital Stay (HOSPITAL_COMMUNITY)
Admission: AD | Admit: 2017-01-10 | Discharge: 2017-01-10 | Disposition: A | Discharge status: Home / Self Care | Payer: Medicaid Other | Source: Ambulatory Visit | Attending: Obstetrics | Admitting: Obstetrics

## 2017-01-10 DIAGNOSIS — N898 Other specified noninflammatory disorders of vagina: Secondary | ICD-10-CM | POA: Diagnosis not present

## 2017-01-10 DIAGNOSIS — O26893 Other specified pregnancy related conditions, third trimester: Secondary | ICD-10-CM | POA: Diagnosis not present

## 2017-01-10 DIAGNOSIS — O9989 Other specified diseases and conditions complicating pregnancy, childbirth and the puerperium: Secondary | ICD-10-CM | POA: Diagnosis not present

## 2017-01-10 DIAGNOSIS — Z3A00 Weeks of gestation of pregnancy not specified: Secondary | ICD-10-CM | POA: Diagnosis not present

## 2017-01-10 DIAGNOSIS — O479 False labor, unspecified: Secondary | ICD-10-CM

## 2017-01-10 DIAGNOSIS — Z87891 Personal history of nicotine dependence: Secondary | ICD-10-CM | POA: Insufficient documentation

## 2017-01-10 LAB — POCT FERN TEST: POCT FERN TEST: NEGATIVE

## 2017-01-10 LAB — AMNISURE RUPTURE OF MEMBRANE (ROM) NOT AT ARMC: AMNISURE: NEGATIVE

## 2017-01-10 NOTE — MAU Note (Signed)
C/o vaginal wetness since Saturday; wetness has increased since Tuesday; fern test negative today; denies any pain;

## 2017-01-10 NOTE — MAU Provider Note (Signed)
History   [id number]   Chief Complaint  Patient presents with  . Rupture of Membranes    HPI Kristina Sampson is a 21 y.o. female  G2P0010 here with report of watery vaginal discharge that began Saturday.  Leaking of fluid has continued off and on. Describes as mucoid & watery discharge. Pt reports irregular contractions and denies vaginal bleeding. Was seen in office on Tuesday; SVE was 1 cm per Dr. Dareen PianoAnderson. Positive fetal movement.     Patient's last menstrual period was 03/25/2016.  OB History  Gravida Para Term Preterm AB Living  2 0 0 0 1 0  SAB TAB Ectopic Multiple Live Births  0 1 0 0      # Outcome Date GA Lbr Len/2nd Weight Sex Delivery Anes PTL Lv  2 Current           1 TAB 09/11/14              Past Medical History:  Diagnosis Date  . Medical history non-contributory     No family history on file.  Social History   Social History  . Marital status: Single    Spouse name: N/A  . Number of children: N/A  . Years of education: N/A   Social History Main Topics  . Smoking status: Former Smoker    Quit date: 06/04/2016  . Smokeless tobacco: Former NeurosurgeonUser  . Alcohol use No  . Drug use: Yes    Types: Marijuana     Comment: prior to preg  . Sexual activity: Not Currently    Birth control/ protection: None     Comment: last intercourse a few months ago   Other Topics Concern  . None   Social History Narrative  . None    Allergies  Allergen Reactions  . Lac Bovis     Milk Other reaction(s): Angioedema Itchy throat  . Other     Kiwi Other reaction(s): Angioedema    No current facility-administered medications on file prior to encounter.    Current Outpatient Prescriptions on File Prior to Encounter  Medication Sig Dispense Refill  . Prenatal Vit-Fe Fumarate-FA (PRENATAL MULTIVITAMIN) TABS tablet Take 1 tablet by mouth daily at 12 noon.    Marland Kitchen. acetaminophen (TYLENOL) 325 MG tablet Take 650 mg by mouth every 6 (six) hours as needed for mild pain.     Clinical research associate. Elastic Bandages & Supports (COMFORT FIT MATERNITY SUPP SM) MISC 1 Units by Does not apply route daily as needed. 1 each 0     Review of Systems  Constitutional: Negative.   Gastrointestinal: Positive for abdominal pain.  Genitourinary: Positive for vaginal discharge. Negative for vaginal bleeding.   Physical Exam   Dilation: 1 Effacement (%): Thick Cervical Position: Posterior Station: Ballotable Presentation: Vertex Exam by:: Janeth Rasehristina Sampson   Vitals:   01/10/17 1034 01/10/17 1248  BP: 129/80 115/66  Pulse: 71 64  Resp: 18 18  Temp: 98.8 F (37.1 C)   TempSrc: Oral   SpO2: 100% 100%  Weight: 138 lb 1.9 oz (62.7 kg)   Height: 5\' 2"  (1.575 m)     Physical Exam  Nursing note and vitals reviewed. Constitutional: She is oriented to person, place, and time. She appears well-developed and well-nourished. No distress.  HENT:  Head: Normocephalic and atraumatic.  Eyes: Conjunctivae are normal. Right eye exhibits no discharge. Left eye exhibits no discharge. No scleral icterus.  Neck: Normal range of motion.  Respiratory: Effort normal. No respiratory distress.  GI: Soft.  There is no tenderness.  Neurological: She is alert and oriented to person, place, and time.  Skin: Skin is warm and dry. She is not diaphoretic.  Psychiatric: She has a normal mood and affect. Her behavior is normal. Judgment and thought content normal.   Fetal Tracing:  Baseline: 135 Variability: moderate Accelerations: 15x15 Decelerations: none  Toco: irr ctx MAU Course  Procedures Results for orders placed or performed during the hospital encounter of 01/10/17 (from the past 24 hour(s))  POCT fern test     Status: Normal   Collection Time: 01/10/17 11:16 AM  Result Value Ref Range   POCT Fern Test Negative = intact amniotic membranes   Amnisure rupture of membrane (rom)not at [facility]     Status: None   Collection Time: 01/10/17 11:50 AM  Result Value Ref Range   Amnisure ROM NEGATIVE      MDM Reactive NST No fern, negative amnisure SVE unchanged from Tuesday S/w Dr. Chestine Spore. Ok to discharge home  Assessment and Plan  A: 1. Vaginal discharge during pregnancy in third trimester   2. False labor    P: Discharge home Discussed reasons to return to MAU Keep f/u with OB   Judeth Horn, NP 01/10/2017 12:21 PM

## 2017-01-10 NOTE — MAU Note (Signed)
Pt reports she has noticed leaking on and off since last Friday. Had an appointment on Tues told her that if it was not continuous not to worry about it. Pt still having leaking and some mucusy discharge. Denies any contractions. Good fetal movement reported.

## 2017-01-10 NOTE — Discharge Instructions (Signed)

## 2017-01-16 ENCOUNTER — Inpatient Hospital Stay (HOSPITAL_COMMUNITY)
Admission: AD | Admit: 2017-01-16 | Discharge: 2017-01-19 | DRG: 775 | Disposition: A | Discharge status: Home / Self Care | Payer: Medicaid Other | Source: Ambulatory Visit | Attending: Obstetrics & Gynecology | Admitting: Obstetrics & Gynecology

## 2017-01-16 ENCOUNTER — Encounter (HOSPITAL_COMMUNITY): Payer: Self-pay

## 2017-01-16 DIAGNOSIS — Z3A38 38 weeks gestation of pregnancy: Secondary | ICD-10-CM

## 2017-01-16 DIAGNOSIS — D573 Sickle-cell trait: Secondary | ICD-10-CM | POA: Diagnosis present

## 2017-01-16 DIAGNOSIS — Z87891 Personal history of nicotine dependence: Secondary | ICD-10-CM | POA: Diagnosis not present

## 2017-01-16 DIAGNOSIS — Z3493 Encounter for supervision of normal pregnancy, unspecified, third trimester: Secondary | ICD-10-CM | POA: Diagnosis present

## 2017-01-16 DIAGNOSIS — O9902 Anemia complicating childbirth: Secondary | ICD-10-CM | POA: Diagnosis present

## 2017-01-16 DIAGNOSIS — Z8759 Personal history of other complications of pregnancy, childbirth and the puerperium: Secondary | ICD-10-CM

## 2017-01-16 DIAGNOSIS — Z3490 Encounter for supervision of normal pregnancy, unspecified, unspecified trimester: Secondary | ICD-10-CM

## 2017-01-16 LAB — CBC
HCT: 39 % (ref 36.0–46.0)
HEMOGLOBIN: 13.5 g/dL (ref 12.0–15.0)
MCH: 27.7 pg (ref 26.0–34.0)
MCHC: 34.6 g/dL (ref 30.0–36.0)
MCV: 80.1 fL (ref 78.0–100.0)
Platelets: 178 10*3/uL (ref 150–400)
RBC: 4.87 MIL/uL (ref 3.87–5.11)
RDW: 14.4 % (ref 11.5–15.5)
WBC: 11 10*3/uL — ABNORMAL HIGH (ref 4.0–10.5)

## 2017-01-16 LAB — TYPE AND SCREEN
ABO/RH(D): O POS
Antibody Screen: NEGATIVE

## 2017-01-16 LAB — POCT FERN TEST

## 2017-01-16 LAB — AMNISURE RUPTURE OF MEMBRANE (ROM) NOT AT ARMC: Amnisure ROM: POSITIVE

## 2017-01-16 LAB — ABO/RH: ABO/RH(D): O POS

## 2017-01-16 MED ORDER — ONDANSETRON HCL 4 MG/2ML IJ SOLN: 4.0000 mg | Freq: Four times a day (QID) | INTRAMUSCULAR | Status: DC | PRN

## 2017-01-16 MED ORDER — FENTANYL CITRATE (PF) 100 MCG/2ML IJ SOLN
50.0000 ug | INTRAMUSCULAR | Status: DC | PRN
  Administered 2017-01-16 (×2): 50 ug via INTRAVENOUS
  Filled 2017-01-16 (×2): qty 2

## 2017-01-16 MED ORDER — LIDOCAINE HCL (PF) 1 % IJ SOLN
30.0000 mL | INTRAMUSCULAR | Status: DC | PRN
  Filled 2017-01-16: qty 30

## 2017-01-16 MED ORDER — LACTATED RINGERS IV SOLN
INTRAVENOUS | Status: DC
  Administered 2017-01-17 (×2): via INTRAVENOUS

## 2017-01-16 MED ORDER — OXYTOCIN BOLUS FROM INFUSION
500.0000 mL | Freq: Once | INTRAVENOUS | Status: AC
  Administered 2017-01-17: 500 mL via INTRAVENOUS

## 2017-01-16 MED ORDER — LACTATED RINGERS IV SOLN
500.0000 mL | INTRAVENOUS | Status: DC | PRN
  Administered 2017-01-17: 250 mL via INTRAVENOUS
  Administered 2017-01-17: 500 mL via INTRAVENOUS

## 2017-01-16 MED ORDER — OXYCODONE-ACETAMINOPHEN 5-325 MG PO TABS
1.0000 | ORAL_TABLET | ORAL | Status: DC | PRN
  Filled 2017-01-16: qty 1

## 2017-01-16 MED ORDER — LACTATED RINGERS IV BOLUS (SEPSIS)
1000.0000 mL | Freq: Once | INTRAVENOUS | Status: AC
  Administered 2017-01-16: 1000 mL via INTRAVENOUS

## 2017-01-16 MED ORDER — OXYCODONE-ACETAMINOPHEN 5-325 MG PO TABS: 2.0000 | ORAL_TABLET | ORAL | Status: DC | PRN

## 2017-01-16 MED ORDER — ACETAMINOPHEN 325 MG PO TABS
650.0000 mg | ORAL_TABLET | ORAL | Status: DC | PRN
  Administered 2017-01-17: 650 mg via ORAL
  Filled 2017-01-16: qty 2

## 2017-01-16 MED ORDER — OXYTOCIN 40 UNITS IN LACTATED RINGERS INFUSION - SIMPLE MED: 2.5000 [IU]/h | INTRAVENOUS | Status: DC

## 2017-01-16 MED ORDER — SOD CITRATE-CITRIC ACID 500-334 MG/5ML PO SOLN: 30.0000 mL | ORAL | Status: DC | PRN

## 2017-01-16 NOTE — MAU Note (Signed)
Contracting since 1000, getting stronger and closer, now every 5 min. No bleeding.  Still having the same watery d/c as a wk ago

## 2017-01-16 NOTE — Anesthesia Pain Management Evaluation Note (Signed)
  CRNA Pain Management Visit Note  Patient: Donnetta HutchingShante S Gethers, 21 y.o., female  "Hello I am a [facility] of the anesthesia team at [facility]. We have an anesthesia team available at all times to provide care throughout the hospital, including epidural management and anesthesia for C-section. I don't know your plan for the delivery whether it a natural birth, water birth, IV sedation, nitrous supplementation, doula or epidural, but we want to meet your pain goals."   1.Was your pain managed to your expectations on prior hospitalizations?   No prior hospitalizations  2.What is your expectation for pain management during this hospitalization?     Epidural  3.How can we help you reach that goal?   Record the patient's initial score and the patient's pain goal.   Pain: 3  Pain Goal: 7 [facility] wants you to be able to say your pain was always managed very well.  Laban EmperorMalinova,Anwyn Kriegel Hristova 01/16/2017

## 2017-01-16 NOTE — Progress Notes (Addendum)
G2P0 @ [redacted] wksga. Presents to triage for ctx since  denies LOF or bleeding. + FM. EFM applied. VSS see flow sheet for details.   SVE: 1.5/30-40/-3   Pt c/o leaking over a week ago. Fern test two days ago negative.   Fern test negative today.   1832: Provider notified. Report status of pt given. Ordered received to do amnisure.   1834: amnisure picked up  1836: amnisure obtained  1840: amnisure taken to lab   1917: Provider notified. Aware amnisure +. Ordered to admit.   1929: Labs and IV done.  1935: Pt to birthing via wheelchair.

## 2017-01-17 ENCOUNTER — Inpatient Hospital Stay (HOSPITAL_COMMUNITY): Payer: Medicaid Other | Admitting: Anesthesiology

## 2017-01-17 ENCOUNTER — Encounter (HOSPITAL_COMMUNITY): Payer: Self-pay

## 2017-01-17 DIAGNOSIS — Z8759 Personal history of other complications of pregnancy, childbirth and the puerperium: Secondary | ICD-10-CM

## 2017-01-17 LAB — RPR: RPR: NONREACTIVE

## 2017-01-17 MED ORDER — FENTANYL 2.5 MCG/ML BUPIVACAINE 1/10 % EPIDURAL INFUSION (WH - ANES)
14.0000 mL/h | INTRAMUSCULAR | Status: DC | PRN
  Administered 2017-01-17: 14 mL/h via EPIDURAL

## 2017-01-17 MED ORDER — SENNOSIDES-DOCUSATE SODIUM 8.6-50 MG PO TABS
2.0000 | ORAL_TABLET | ORAL | Status: DC
  Administered 2017-01-18 (×2): 2 via ORAL
  Filled 2017-01-17 (×2): qty 2

## 2017-01-17 MED ORDER — COCONUT OIL OIL
1.0000 | TOPICAL_OIL | Status: DC | PRN
Start: 2017-01-17 — End: 2017-01-19

## 2017-01-17 MED ORDER — PHENYLEPHRINE 40 MCG/ML (10ML) SYRINGE FOR IV PUSH (FOR BLOOD PRESSURE SUPPORT)
80.0000 ug | PREFILLED_SYRINGE | INTRAVENOUS | Status: DC | PRN
  Filled 2017-01-17: qty 5

## 2017-01-17 MED ORDER — DIPHENHYDRAMINE HCL 50 MG/ML IJ SOLN: 12.5000 mg | INTRAMUSCULAR | Status: DC | PRN

## 2017-01-17 MED ORDER — PROMETHAZINE HCL 25 MG/ML IJ SOLN: 25.0000 mg | Freq: Four times a day (QID) | INTRAMUSCULAR | Status: DC | PRN

## 2017-01-17 MED ORDER — EPHEDRINE 5 MG/ML INJ
10.0000 mg | INTRAVENOUS | Status: DC | PRN
  Filled 2017-01-17: qty 2

## 2017-01-17 MED ORDER — DIPHENHYDRAMINE HCL 25 MG PO CAPS: 25.0000 mg | ORAL_CAPSULE | Freq: Four times a day (QID) | ORAL | Status: DC | PRN

## 2017-01-17 MED ORDER — ONDANSETRON HCL 4 MG/2ML IJ SOLN
4.0000 mg | INTRAMUSCULAR | Status: DC | PRN
  Administered 2017-01-17: 4 mg via INTRAVENOUS
  Filled 2017-01-17: qty 2

## 2017-01-17 MED ORDER — PRENATAL MULTIVITAMIN CH
1.0000 | ORAL_TABLET | Freq: Every day | ORAL | Status: DC
  Administered 2017-01-18: 1 via ORAL
  Filled 2017-01-17 (×2): qty 1

## 2017-01-17 MED ORDER — BENZOCAINE-MENTHOL 20-0.5 % EX AERO
1.0000 "application " | INHALATION_SPRAY | CUTANEOUS | Status: DC | PRN
  Administered 2017-01-17: 1 via TOPICAL
  Filled 2017-01-17: qty 56

## 2017-01-17 MED ORDER — TERBUTALINE SULFATE 1 MG/ML IJ SOLN
0.2500 mg | Freq: Once | INTRAMUSCULAR | Status: DC | PRN
  Filled 2017-01-17: qty 1

## 2017-01-17 MED ORDER — LACTATED RINGERS IV SOLN: 500.0000 mL | Freq: Once | INTRAVENOUS | Status: DC

## 2017-01-17 MED ORDER — FENTANYL 2.5 MCG/ML BUPIVACAINE 1/10 % EPIDURAL INFUSION (WH - ANES): 14.0000 mL/h | INTRAMUSCULAR | Status: DC | PRN

## 2017-01-17 MED ORDER — IBUPROFEN 600 MG PO TABS
600.0000 mg | ORAL_TABLET | Freq: Four times a day (QID) | ORAL | Status: DC
  Administered 2017-01-17 – 2017-01-18 (×7): 600 mg via ORAL
  Filled 2017-01-17 (×8): qty 1

## 2017-01-17 MED ORDER — FENTANYL 2.5 MCG/ML BUPIVACAINE 1/10 % EPIDURAL INFUSION (WH - ANES)
INTRAMUSCULAR | Status: AC
  Filled 2017-01-17: qty 100

## 2017-01-17 MED ORDER — OXYCODONE-ACETAMINOPHEN 5-325 MG PO TABS
1.0000 | ORAL_TABLET | ORAL | Status: DC | PRN
  Administered 2017-01-17: 1 via ORAL

## 2017-01-17 MED ORDER — ACETAMINOPHEN 325 MG PO TABS: 650.0000 mg | ORAL_TABLET | ORAL | Status: DC | PRN

## 2017-01-17 MED ORDER — SIMETHICONE 80 MG PO CHEW: 80.0000 mg | CHEWABLE_TABLET | ORAL | Status: DC | PRN

## 2017-01-17 MED ORDER — TETANUS-DIPHTH-ACELL PERTUSSIS 5-2.5-18.5 LF-MCG/0.5 IM SUSP: 0.5000 mL | Freq: Once | INTRAMUSCULAR | Status: DC

## 2017-01-17 MED ORDER — DIBUCAINE 1 % RE OINT
1.0000 "application " | TOPICAL_OINTMENT | RECTAL | Status: DC | PRN
  Administered 2017-01-17: 1 via RECTAL
  Filled 2017-01-17: qty 28

## 2017-01-17 MED ORDER — ZOLPIDEM TARTRATE 5 MG PO TABS: 5.0000 mg | ORAL_TABLET | Freq: Every evening | ORAL | Status: DC | PRN

## 2017-01-17 MED ORDER — PHENYLEPHRINE 40 MCG/ML (10ML) SYRINGE FOR IV PUSH (FOR BLOOD PRESSURE SUPPORT)
PREFILLED_SYRINGE | INTRAVENOUS | Status: AC
  Filled 2017-01-17: qty 20

## 2017-01-17 MED ORDER — ONDANSETRON HCL 4 MG PO TABS: 4.0000 mg | ORAL_TABLET | ORAL | Status: DC | PRN

## 2017-01-17 MED ORDER — METHYLERGONOVINE MALEATE 0.2 MG/ML IJ SOLN
0.2000 mg | Freq: Once | INTRAMUSCULAR | Status: AC
  Administered 2017-01-17: 0.2 mg via INTRAMUSCULAR
  Filled 2017-01-17: qty 1

## 2017-01-17 MED ORDER — LIDOCAINE HCL (PF) 1 % IJ SOLN
INTRAMUSCULAR | Status: DC | PRN
  Administered 2017-01-17 (×2): 4 mL

## 2017-01-17 MED ORDER — OXYCODONE-ACETAMINOPHEN 5-325 MG PO TABS: 2.0000 | ORAL_TABLET | ORAL | Status: DC | PRN

## 2017-01-17 MED ORDER — WITCH HAZEL-GLYCERIN EX PADS
1.0000 "application " | MEDICATED_PAD | CUTANEOUS | Status: DC | PRN
  Administered 2017-01-17: 1 via TOPICAL

## 2017-01-17 MED ORDER — OXYTOCIN 40 UNITS IN LACTATED RINGERS INFUSION - SIMPLE MED: 2.5000 [IU]/h | INTRAVENOUS | Status: DC | PRN

## 2017-01-17 MED ORDER — OXYTOCIN 40 UNITS IN LACTATED RINGERS INFUSION - SIMPLE MED
1.0000 m[IU]/min | INTRAVENOUS | Status: DC
  Administered 2017-01-17: 2 m[IU]/min via INTRAVENOUS
  Filled 2017-01-17: qty 1000

## 2017-01-17 NOTE — Anesthesia Procedure Notes (Signed)
Epidural Patient location during procedure: OB  Staffing Anesthesiologist: Judas Mohammad Performed: anesthesiologist   Preanesthetic Checklist Completed: patient identified, pre-op evaluation, timeout performed, IV checked, risks and benefits discussed and monitors and equipment checked  Epidural Patient position: sitting Prep: site prepped and draped and DuraPrep Patient monitoring: heart rate, continuous pulse ox and blood pressure Approach: midline Location: L2-L3 Injection technique: LOR air and LOR saline  Needle:  Needle type: Tuohy  Needle gauge: 17 G Needle length: 9 cm Needle insertion depth: 6 cm Catheter type: closed end flexible Catheter size: 19 Gauge Catheter at skin depth: 11 cm Test dose: negative  Assessment Sensory level: T8 Events: blood not aspirated, injection not painful, no injection resistance, negative IV test and no paresthesia  Additional Notes Reason for block:procedure for pain     

## 2017-01-17 NOTE — H&P (Signed)
Donnetta HutchingShante S Rafanan is a 21 y.o. female G2P0010 at 38 weeks 5 days presenting for leaking of fluid. She notes that She has been leaking for one week.  Amnisure today in MAU was positive. No vaginal bleeding, regular contractions.  She notes good fetal movement.   OB History    Gravida Para Term Preterm AB Living   2 0 0 0 1 0   SAB TAB Ectopic Multiple Live Births   0 1 0 0       Past Medical History:  Diagnosis Date  . Medical history non-contributory    Past Surgical History:  Procedure Laterality Date  . DILATION AND CURETTAGE OF UTERUS     Family History: family history includes Cancer in her maternal grandfather; Depression in her sister; Hypertension in her mother; Miscarriages / Stillbirths in her sister. Social History:  reports that she quit smoking about 7 months ago. She has quit using smokeless tobacco. She reports that she uses drugs, including Marijuana. She reports that she does not drink alcohol.     Maternal Diabetes: No Genetic Screening: Normal Maternal Ultrasounds/Referrals: Normal Fetal Ultrasounds or other Referrals:  Other: normal anatomy scan Maternal Substance Abuse:  No Significant Maternal Medications:  None Significant Maternal Lab Results:  Lab values include: Group B Strep negative, Other:  Sickle cell trait pos Other Comments:  None  Review of Systems  Constitutional: Negative.   Eyes: Negative.  Negative for blurred vision and double vision.  All other systems reviewed and are negative.  Maternal Medical History:  Reason for admission: Rupture of membranes.   Contractions: Onset was 6-12 hours ago.   Frequency: regular.   Duration is approximately 60 seconds.   Perceived severity is moderate.    Fetal activity: Perceived fetal activity is normal.   Last perceived fetal movement was within the past hour.    Prenatal complications: no prenatal complications Prenatal Complications - Diabetes: none.    Dilation: 4 Effacement (%):  80 Station: -1 Exam by:: Dr. Mora ApplPinn Blood pressure 126/77, pulse 65, temperature 98.8 F (37.1 C), temperature source Oral, resp. rate 18, weight 63.2 kg (139 lb 4 oz), last menstrual period 03/25/2016, SpO2 99 %. Maternal Exam:  Uterine Assessment: Contraction strength is moderate.  Contraction duration is 60 seconds. Contraction frequency is irregular.   Abdomen: Patient reports no abdominal tenderness. Fundal height is 38 cm.   Estimated fetal weight is 2900 grams.   Fetal presentation: vertex  Introitus: Normal vulva. Normal vagina.  Nitrazine test: positive. Amniotic fluid character: clear.  Pelvis: adequate for delivery.   Cervix: Cervix evaluated by digital exam.     Fetal Exam Fetal Monitor Review: Baseline rate: 160.  Variability: moderate (6-25 bpm).   Pattern: accelerations present and no decelerations.    Fetal State Assessment: Category I - tracings are normal.     Physical Exam  Nursing note and vitals reviewed. Constitutional: She appears well-developed and well-nourished.  Genitourinary: Vagina normal.    Prenatal labs: ABO, Rh: --/--/O POS, O POS (06/13 1930) Antibody: NEG (06/13 1930) Rubella: Immune (01/12 0000) RPR:   NR HBsAg: Negative (01/11 0000)  HIV: Non-reactive (01/12 0000)  GBS: Negative (05/25 0000)   Assessment/Plan: 21 yo G2P0010 at 38 weeks 5 days prolonged premature rupture of membranes Admit to Labor and Delivery Pitocin for augmentation of labor AROM done of remaining bag - she may have had a high leak Continuous monitoring Epidural on demand IV pain meds for now   Essie HartINN, Rakeya Glab STACIA 01/17/2017,  1:38 AM

## 2017-01-17 NOTE — Lactation Note (Signed)
This note was copied from a baby's chart. Lactation Consultation Note  Patient Name: Kristina Sampson ZOXWR'UToday's Date: 01/17/2017 Reason for consult: Initial assessment   Initial assessment with first time mom of 11 hour old infant. Mom reports infant fed well earlier and it is about time to feed him again.   Infant was asleep in mom's arms. Enc mom to unwrap him and place him STS, she did so. Mom attempted to latch infant and he was not interested in latching. Worked with mom on the cross cradle vs cradle hold. Mom was shown to hand express and she expressed 3 cc that was fed to infant in a spoon, mom assisted with spoon feeding. Enc mom to spoon feed after BF or before if infant sleepy. Reviewed awakening techniques with mom.  About 10 minutes later infant latched to breast. Infant was still nursing when [facility] left room. Mom with compressible breasts and areola with everted nipples. Colostrum easily expressible from both breasts.   Enc mom to feed infant STS 8-12 x in 24 hours at first feeding cues. Enc mom to hand express before latch to get milk flowing. Enc mom to massage/compress breast with feeding. Mom did all that was shown to her. Mom was using good pillow and head support with feeding. Reviewed supply and demand, colostrum and milk coming to volume. Reviewed I/O, mom concerned infant has not voided or stooled since birth, told her to continue feeding and would like to see infant with at least 1 void and 1 stool within first 24 hours.   BF Resources Handout and LC Brochure given, mom informed of IP/OP Services, BF Support Groups and LC phone #. [facility] not [facility]. Mom asking about renting a DEBP at d/c. [facility] if medical reason for mom to be pumping at d/c otherwise she can call WIC to try and get a DEBP from them.   Enc mom to call out to desk for feeding assistance as needed. Mom without further questions/concerns at this time.     Maternal Data Formula Feeding for Exclusion: No Has patient been taught Hand Expression?: Yes Does the patient have breastfeeding experience prior to this delivery?: No  Feeding Feeding Type: Breast Fed Length of feed: 10 min  LATCH Score/Interventions Latch: Repeated attempts needed to sustain latch, nipple held in mouth throughout feeding, stimulation needed to elicit sucking reflex. Intervention(s): Adjust position;Assist with latch;Breast massage;Breast compression  Audible Swallowing: A few with stimulation Intervention(s): Skin to skin;Hand expression Intervention(s): Skin to skin;Hand expression;Alternate breast massage  Type of Nipple: Everted at rest and after stimulation  Comfort (Breast/Nipple): Soft / non-tender     Hold (Positioning): Assistance needed to correctly position infant at breast and maintain latch. Intervention(s): Breastfeeding basics reviewed;Support Pillows;Position options;Skin to skin  LATCH Score: 7  Lactation Tools Discussed/Used WIC Program: No (Plans to apply)   Consult Status Consult Status: Follow-up Date: 01/18/17 Follow-up type: In-patient    Silas FloodSharon S Hice 01/17/2017, 5:09 PM

## 2017-01-17 NOTE — Anesthesia Postprocedure Evaluation (Signed)
Anesthesia Post Note  Patient: Kristina HutchingShante S Jacko  Procedure(s) Performed: * No procedures listed *     Patient location during evaluation: Mother Baby Anesthesia Type: Epidural Level of consciousness: awake Pain management: pain level controlled Vital Signs Assessment: post-procedure vital signs reviewed and stable Respiratory status: spontaneous breathing Cardiovascular status: stable Postop Assessment: no headache, no backache, epidural receding and patient able to bend at knees Anesthetic complications: no    Last Vitals:  Vitals:   01/17/17 0900 01/17/17 1259  BP: 128/76 (!) 115/39  Pulse: 71 62  Resp: 20 18  Temp: 37.3 C 37 C    Last Pain:  Vitals:   01/17/17 1259  TempSrc:   PainSc: 4    Pain Goal:                 Edison PaceWILKERSON,Zaion Hreha

## 2017-01-17 NOTE — Progress Notes (Signed)
UR chart review completed.  

## 2017-01-17 NOTE — Plan of Care (Signed)
Problem: Coping: Goal: Ability to cope will improve Patient emotional status WNL  Outcome: Completed/Met Date Met: 01/17/17 Emotional status WNL

## 2017-01-17 NOTE — Anesthesia Preprocedure Evaluation (Signed)
Anesthesia Evaluation  °Patient identified by MRN, date of birth, ID band °Patient awake ° ° ° °Reviewed: °Allergy & Precautions, NPO status , Patient's Chart, lab work & pertinent test results ° °Airway °Mallampati: II ° °TM Distance: >3 FB °Neck ROM: Full ° ° ° Dental °no notable dental hx. ° °  °Pulmonary °former smoker,  °  °Pulmonary exam normal °breath sounds clear to auscultation ° ° ° ° ° ° Cardiovascular °negative cardio ROS °Normal cardiovascular exam °Rhythm:Regular Rate:Normal ° ° °  °Neuro/Psych °negative neurological ROS ° negative psych ROS  ° GI/Hepatic °negative GI ROS, Neg liver ROS,   °Endo/Other  °negative endocrine ROS ° Renal/GU °negative Renal ROS  ° °  °Musculoskeletal °negative musculoskeletal ROS °(+)  ° Abdominal °  °Peds ° Hematology °negative hematology ROS °(+)   °Anesthesia Other Findings ° ° Reproductive/Obstetrics °(+) Pregnancy ° °  ° ° ° ° ° ° ° ° ° ° ° ° ° °  °  ° ° ° ° ° ° ° ° °Anesthesia Physical ° °Anesthesia Plan ° °ASA: II ° °Anesthesia Plan: Epidural  ° °Post-op Pain Management:   ° °Induction:  ° °PONV Risk Score and Plan:  ° °Airway Management Planned:  ° °Additional Equipment:  ° °Intra-op Plan:  ° °Post-operative Plan:  ° °Informed Consent: I have reviewed the patients History and Physical, chart, labs and discussed the procedure including the risks, benefits and alternatives for the proposed anesthesia with the patient or authorized representative who has indicated his/her understanding and acceptance.  ° ° ° ° ° °Plan Discussed with:  ° °Anesthesia Plan Comments:   ° ° ° ° ° ° °Anesthesia Quick Evaluation ° °

## 2017-01-18 LAB — CBC
HEMATOCRIT: 32 % — AB (ref 36.0–46.0)
Hemoglobin: 11.2 g/dL — ABNORMAL LOW (ref 12.0–15.0)
MCH: 27.9 pg (ref 26.0–34.0)
MCHC: 35 g/dL (ref 30.0–36.0)
MCV: 79.8 fL (ref 78.0–100.0)
Platelets: 142 10*3/uL — ABNORMAL LOW (ref 150–400)
RBC: 4.01 MIL/uL (ref 3.87–5.11)
RDW: 14.6 % (ref 11.5–15.5)
WBC: 14.5 10*3/uL — AB (ref 4.0–10.5)
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
## 2017-01-18 NOTE — Progress Notes (Signed)
Patient is eating, ambulating, voiding.  Pain control is good.  Appropriate lochia, no complaints.  Vitals:   01/17/17 2015 01/17/17 2200 01/17/17 2300 01/18/17 0542  BP: (!) 117/52 127/76  (!) 126/58  Pulse: 72 68  70  Resp: 16 16    Temp: 98.7 F (37.1 C) 98.8 F (37.1 C)  98.8 F (37.1 C)  TempSrc: Oral Oral  Oral  SpO2: 100%     Weight:   63 kg (139 lb)   Height:   5\' 2"  (1.575 m)     Fundus firm Perineum without swelling. Ext; no CT  Lab Results  Component Value Date   WBC 14.5 (H) 01/18/2017   HGB 11.2 (L) 01/18/2017   HCT 32.0 (L) 01/18/2017   MCV 79.8 01/18/2017   PLT 142 (L) 01/18/2017    --/--/O POS, O POS (06/13 1930)  A/P Post partum day 1  Routine care.  Expect d/c 6/16.    Philip AspenALLAHAN, Yahel Fuston

## 2017-01-19 NOTE — Discharge Summary (Signed)
Obstetric Discharge Summary Reason for Admission: rupture of membranes Prenatal Procedures: ultrasound Intrapartum Procedures: vacuum Postpartum Procedures: none Complications-Operative and Postpartum: 2nd degree perineal laceration Hemoglobin  Date Value Ref Range Status  01/18/2017 11.2 (L) 12.0 - 15.0 g/dL Final   HCT  Date Value Ref Range Status  01/18/2017 32.0 (L) 36.0 - 46.0 % Final    Physical Exam:  General: alert and cooperative Lochia: appropriate Uterine Fundus: firm DVT Evaluation: No evidence of DVT seen on physical exam.  Discharge Diagnoses: Term Pregnancy-delivered  Discharge Information: Date: 01/19/2017 Activity: pelvic rest Diet: routine Medications: PNV and Ibuprofen Condition: stable Instructions: refer to practice specific booklet Discharge to: home Follow-up Information    Essie HartPinn, Walda, MD Follow up in 4 week(s).   Specialty:  Obstetrics and Gynecology Contact information: [address] [place] [address] [phone]           Newborn Data: Live born female  Birth Weight: 7 lb 0.5 oz (3189 g) APGAR: 7, 9  Home with mother.  Kristina Sampson, Kristina Sampson 01/19/2017, 10:28 AM

## 2017-01-19 NOTE — Lactation Note (Signed)
This note was copied from a baby's chart. Lactation Consultation Note  Patient Name: Kristina Criselda PeachesShante Mcgriff WUJWJ'XToday's Date: 01/19/2017 Reason for consult: Follow-up assessment   With this mom of a term baby, now 3953 hours old. Mom is breast feeding and pumping and supplementing  with her EBM. Mom was given an hand pump to take home, and has a DEP at home, [employer] brand. She knows to call lactation for questions/concerns, and knows about the free services available.   Maternal Data    Feeding Feeding Type: Bottle Fed - Breast Milk  LATCH Score/Interventions                      Lactation Tools Discussed/Used     Consult Status Consult Status: Complete Follow-up type: Call as needed    Alfred LevinsLee, Seba Madole Anne 01/19/2017, 11:17 AM

## 2019-03-31 ENCOUNTER — Other Ambulatory Visit: Payer: Self-pay

## 2019-03-31 ENCOUNTER — Inpatient Hospital Stay (HOSPITAL_COMMUNITY)
Admission: AD | Admit: 2019-03-31 | Discharge: 2019-03-31 | Disposition: A | Discharge status: Home / Self Care | Payer: Self-pay | Attending: Obstetrics | Admitting: Obstetrics

## 2019-03-31 DIAGNOSIS — N898 Other specified noninflammatory disorders of vagina: Secondary | ICD-10-CM

## 2019-03-31 LAB — POCT PREGNANCY, URINE: Preg Test, Ur: NEGATIVE

## 2019-03-31 NOTE — MAU Note (Signed)
Had symptoms of a yeast infection, has been getting recurrent.  Tried coconut oil, didn't help, is a lot worse. Now it is yellowish green. Not itching now. No pain. Not pregnant

## 2019-03-31 NOTE — MAU Provider Note (Signed)
S Ms. Kristina Sampson is a 23 y.o. G10P1011 non-pregnant female who presents to MAU today with complaint of vaginal discharge.   O BP 114/60 (BP Location: Right Arm)   Pulse 69   Temp 99 F (37.2 C) (Oral)   Resp 16   Wt 58.2 kg   LMP 03/07/2019   SpO2 100%   BMI 23.48 kg/m  Physical Exam  Nursing note and vitals reviewed. Constitutional: She is oriented to person, place, and time. She appears well-developed and well-nourished. No distress.  HENT:  Head: Normocephalic.  Cardiovascular: Normal rate.  Respiratory: Effort normal.  Neurological: She is alert and oriented to person, place, and time.  Psychiatric: She has a normal mood and affect.    A Non pregnant female Medical screening exam complete   P Discharge from MAU in stable condition Patient given the option of transfer to [facility] for further evaluation or seek care in outpatient facility of choice List of options for follow-up given  Warning signs for worsening condition that would warrant emergency follow-up discussed Patient may return to MAU as needed for pregnancy related complaints  Marcille Buffy DNP, CNM  03/31/19  6:46 PM

## 2019-09-17 ENCOUNTER — Ambulatory Visit: Payer: Medicaid Other | Attending: Internal Medicine

## 2019-09-17 DIAGNOSIS — Z20822 Contact with and (suspected) exposure to covid-19: Secondary | ICD-10-CM

## 2019-09-18 LAB — NOVEL CORONAVIRUS, NAA: SARS-CoV-2, NAA: NOT DETECTED

## 2020-04-26 ENCOUNTER — Emergency Department (HOSPITAL_COMMUNITY)
Admission: EM | Admit: 2020-04-26 | Discharge: 2020-04-26 | Disposition: A | Discharge status: Home / Self Care | Payer: Medicaid Other | Attending: Emergency Medicine | Admitting: Emergency Medicine

## 2020-04-26 ENCOUNTER — Encounter (HOSPITAL_COMMUNITY): Payer: Self-pay | Admitting: *Deleted

## 2020-04-26 ENCOUNTER — Other Ambulatory Visit: Payer: Self-pay

## 2020-04-26 DIAGNOSIS — S161XXA Strain of muscle, fascia and tendon at neck level, initial encounter: Secondary | ICD-10-CM | POA: Insufficient documentation

## 2020-04-26 DIAGNOSIS — Y92410 Unspecified street and highway as the place of occurrence of the external cause: Secondary | ICD-10-CM | POA: Insufficient documentation

## 2020-04-26 DIAGNOSIS — Y9389 Activity, other specified: Secondary | ICD-10-CM | POA: Insufficient documentation

## 2020-04-26 DIAGNOSIS — Z87891 Personal history of nicotine dependence: Secondary | ICD-10-CM | POA: Insufficient documentation

## 2020-04-26 DIAGNOSIS — Y998 Other external cause status: Secondary | ICD-10-CM | POA: Insufficient documentation

## 2020-04-26 DIAGNOSIS — R519 Headache, unspecified: Secondary | ICD-10-CM | POA: Insufficient documentation

## 2020-04-26 MED ORDER — METHOCARBAMOL 500 MG PO TABS
500.0000 mg | ORAL_TABLET | Freq: Once | ORAL | Status: AC
  Administered 2020-04-26: 500 mg via ORAL
  Filled 2020-04-26: qty 1

## 2020-04-26 MED ORDER — METHOCARBAMOL 500 MG PO TABS: 500.0000 mg | ORAL_TABLET | Freq: Two times a day (BID) | ORAL | 0 refills | Status: AC
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
## 2020-05-19 ENCOUNTER — Encounter (HOSPITAL_BASED_OUTPATIENT_CLINIC_OR_DEPARTMENT_OTHER): Payer: Self-pay | Admitting: *Deleted

## 2020-05-19 ENCOUNTER — Emergency Department (HOSPITAL_BASED_OUTPATIENT_CLINIC_OR_DEPARTMENT_OTHER)
Admission: EM | Admit: 2020-05-19 | Discharge: 2020-05-19 | Disposition: A | Discharge status: Home / Self Care | Payer: Self-pay | Attending: Emergency Medicine | Admitting: Emergency Medicine

## 2020-05-19 ENCOUNTER — Other Ambulatory Visit: Payer: Self-pay

## 2020-05-19 DIAGNOSIS — B9689 Other specified bacterial agents as the cause of diseases classified elsewhere: Secondary | ICD-10-CM

## 2020-05-19 DIAGNOSIS — N751 Abscess of Bartholin's gland: Secondary | ICD-10-CM | POA: Insufficient documentation

## 2020-05-19 DIAGNOSIS — N760 Acute vaginitis: Secondary | ICD-10-CM

## 2020-05-19 DIAGNOSIS — Z87891 Personal history of nicotine dependence: Secondary | ICD-10-CM | POA: Insufficient documentation

## 2020-05-19 DIAGNOSIS — N750 Cyst of Bartholin's gland: Secondary | ICD-10-CM

## 2020-05-19 LAB — WET PREP, GENITAL
Sperm: NONE SEEN
Trich, Wet Prep: NONE SEEN
Yeast Wet Prep HPF POC: NONE SEEN

## 2020-05-19 LAB — HIV ANTIBODY (ROUTINE TESTING W REFLEX): HIV Screen 4th Generation wRfx: NONREACTIVE

## 2020-05-19 LAB — PREGNANCY, URINE: Preg Test, Ur: NEGATIVE

## 2020-05-19 MED ORDER — LIDOCAINE-EPINEPHRINE 1 %-1:100000 IJ SOLN
INTRAMUSCULAR | Status: AC
  Administered 2020-05-19: 1 mL
  Filled 2020-05-19: qty 1

## 2020-05-19 MED ORDER — CLINDAMYCIN HCL 300 MG PO CAPS: 300.0000 mg | ORAL_CAPSULE | Freq: Four times a day (QID) | ORAL | 0 refills | Status: AC

## 2020-05-19 MED ORDER — LIDOCAINE-EPINEPHRINE 2 %-1:100000 IJ SOLN
20.0000 mL | Freq: Once | INTRAMUSCULAR | Status: AC
  Administered 2020-05-19: 20 mL via INTRADERMAL

## 2020-05-19 NOTE — ED Triage Notes (Signed)
Abscess to her bartholin cyst. Hx of same.

## 2020-05-19 NOTE — Discharge Instructions (Addendum)
While you are here, we drained your Bartholin duct cyst.  I am sorry that this has been a recurring problem for you.  For now, leave the packing in place and return to the ED in 2 days to have it removed and reassessed.  Please also start taking your antibiotic (clindamycin) 3 times daily.  I have given you a total of a 7-day course.  This will also treat the bacterial vaginosis that we found on exam today.  I think this problem will continue to recur unless it is addressed with a more permanent fix.  I recommend that you return to your OB/gyne physician to address this ongoing problem.

## 2020-05-19 NOTE — ED Provider Notes (Signed)
[facility] EMERGENCY DEPARTMENT Provider Note   CSN: [id number] Arrival date & time: 05/19/20  1257     History Chief Complaint  Patient presents with   Abscess    Kristina Sampson is a 24 y.o. female.  Kristina Sampson is a 24 year old woman who presents to the ED with swollen painful labia.  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  She reports that she has been having ongoing swelling, pain and erythema in her labia for the past week.  She has tried topical medicine and baths and even the application of onions which has not been helpful.  She has been out of work for the past week to do her terrible pain with ambulation.  She has now gotten to the point where she simply cannot function at home due to her enormous discomfort.  In addition to her vulvar discomfort, she is also having some white, thin vaginal discharge.  It is not chunky or malodorous.  She specifically denies abdominal pain, dysuria, nausea, vomiting.  She is sexually active.        Past Medical History:  Diagnosis Date   Medical history non-contributory     Patient Active Problem List   Diagnosis Date Noted   Status post vacuum-assisted vaginal delivery 01/17/2017   Pregnancy 01/16/2017    Past Surgical History:  Procedure Laterality Date   DILATION AND CURETTAGE OF UTERUS       OB History    Gravida  2   Para  1   Term  1   Preterm  0   AB  1   Living  1     SAB  0   TAB  1   Ectopic  0   Multiple  0   Live Births  1           Family History  Problem Relation Age of Onset   Hypertension Mother    Depression Sister    Miscarriages / Stillbirths Sister    Cancer Maternal Grandfather     Social History   Tobacco Use   Smoking status: Former Smoker    Quit date: 06/04/2016    Years since quitting: 3.9   Smokeless tobacco: Former Neurosurgeon  Substance Use Topics   Alcohol use: No   Drug  use: Yes    Types: Marijuana    Comment: prior to preg    Home Medications Prior to Admission medications   Medication Sig Start Date End Date Taking? Authorizing Provider  acetaminophen (TYLENOL) 325 MG tablet Take 650 mg by mouth every 6 (six) hours as needed for mild pain.    [provider]  methocarbamol (ROBAXIN) 500 MG tablet Take 1 tablet (500 mg total) by mouth 2 (two) times daily. 04/26/20   Gailen Shelter, PA  Prenatal Vit-Fe Fumarate-FA (PRENATAL MULTIVITAMIN) TABS tablet Take 1 tablet by mouth daily at 12 noon.    [provider]    Allergies    Lac bovis and Other  Review of Systems   Review of Systems  Constitutional: Negative for fatigue and fever.  HENT: Negative for sore throat.   Respiratory: Negative for shortness of breath.   Cardiovascular: Negative for leg swelling.  Gastrointestinal: Negative for abdominal pain, nausea and vomiting.  Genitourinary: Positive for vaginal discharge and vaginal pain. Negative for dysuria.  Skin: Positive for wound. Negative for rash.  Neurological: Negative for weakness.  Physical Exam Updated Vital Signs BP 132/80 (BP Location: Right Arm)    Pulse 90    Temp 99.4 F (37.4 C) (Oral)    Resp 16    Ht 5\' 2"  (1.575 m)    Wt 56.7 kg    LMP 04/26/2020    SpO2 100%    BMI 22.86 kg/m   Physical Exam Constitutional:      General: She is not in acute distress.    Appearance: Normal appearance. She is normal weight. She is not ill-appearing.  HENT:     Head: Normocephalic.     Nose: Nose normal. No congestion or rhinorrhea.     Mouth/Throat:     Mouth: Mucous membranes are dry.     Pharynx: Oropharynx is clear. No oropharyngeal exudate.  Eyes:     Extraocular Movements: Extraocular movements intact.     Conjunctiva/sclera: Conjunctivae normal.     Pupils: Pupils are equal, round, and reactive to light.  Cardiovascular:     Rate and Rhythm: Normal rate.  Pulmonary:     Effort: Pulmonary effort is  normal.     Breath sounds: Normal breath sounds.  Abdominal:     General: Abdomen is flat.     Palpations: Abdomen is soft.  Genitourinary:   Musculoskeletal:     Cervical back: Normal range of motion.  Skin:    Findings: Lesion present.     Comments: Pelvic: Right labia minora was grossly swollen to a size of about 2 cm x 5 cm.  Mildly erythematous and exquisitely tender to palpation.  Neurological:     General: No focal deficit present.     Mental Status: She is alert. Mental status is at baseline.     Cranial Nerves: No cranial nerve deficit.     ED Results / Procedures / Treatments   Labs (all labs ordered are listed, but only abnormal results are displayed) Labs Reviewed  WET PREP, GENITAL  PREGNANCY, URINE  RPR  HIV ANTIBODY (ROUTINE TESTING W REFLEX)  GC/CHLAMYDIA PROBE AMP ([place]) NOT AT [facility]    EKG None  Radiology No results found.  Procedures Procedures (including critical care time)  Medications Ordered in ED Medications  lidocaine-EPINEPHrine (XYLOCAINE W/EPI) 2 %-1:100000 (with pres) injection 20 mL (has no administration in time range)  lidocaine-EPINEPHrine (XYLOCAINE W/EPI) 1 %-1:100000 (with pres) injection (has no administration in time range)    ED Course  I have reviewed the triage vital signs and the nursing notes.  Pertinent labs & imaging results that were available during my care of the patient were reviewed by me and considered in my medical decision making (see chart for details).    MDM Rules/Calculators/A&P                          Kristina Sampson is a 24 year old woman who presents to the ED with a recurrence of her Bartholin cyst.  There is no evidence of systemic infection at this time.  After discussion of the risks and benefits of incision and drainage, the cyst was incised with drainage of copious, malodorous, white purulent discharge with additional dark-colored debris.  After drainage of the cyst, the wound was packed without  difficulty or complication.  In addition to her Bartholin cyst, she was also found to have bacterial vaginosis.  She was sent home with a course of clindamycin and encouraged to return to the ED in 2 days to have the cyst unpacked and  reassessed.  She was also encouraged to reestablish care with an OB/GYN physician for the best care for her recurrent Bartholin cyst troubles. Final Clinical Impression(s) / ED Diagnoses Final diagnoses:  None    Rx / DC Orders ED Discharge Orders    None       Mirian Mo, MD 05/19/20 Herbie [place]    Charlynne Pander, MD 05/23/20 0730

## 2020-05-19 NOTE — ED Notes (Signed)
Pt. Reports cyst on the R labia that has been getting worse since Monday.  Pt. Reports pain is bad and swelling to R labia.

## 2020-05-20 LAB — RPR: RPR Ser Ql: NONREACTIVE

## 2020-05-20 LAB — GC/CHLAMYDIA PROBE AMP (~~LOC~~) NOT AT ARMC
Chlamydia: NEGATIVE
Comment: NEGATIVE
Comment: NORMAL
Neisseria Gonorrhea: NEGATIVE

## 2020-05-22 ENCOUNTER — Emergency Department (HOSPITAL_BASED_OUTPATIENT_CLINIC_OR_DEPARTMENT_OTHER)
Admission: EM | Admit: 2020-05-22 | Discharge: 2020-05-22 | Disposition: A | Discharge status: Home / Self Care | Payer: Medicaid Other | Attending: Emergency Medicine | Admitting: Emergency Medicine

## 2020-05-22 ENCOUNTER — Encounter (HOSPITAL_BASED_OUTPATIENT_CLINIC_OR_DEPARTMENT_OTHER): Payer: Self-pay | Admitting: Emergency Medicine

## 2020-05-22 ENCOUNTER — Other Ambulatory Visit: Payer: Self-pay

## 2020-05-22 DIAGNOSIS — Z87891 Personal history of nicotine dependence: Secondary | ICD-10-CM | POA: Insufficient documentation

## 2020-05-22 DIAGNOSIS — Z4800 Encounter for change or removal of nonsurgical wound dressing: Secondary | ICD-10-CM | POA: Insufficient documentation

## 2020-05-22 NOTE — Discharge Instructions (Addendum)
Return to the ER for worsening swelling, drainage, fever or abdominal pain.

## 2020-05-22 NOTE — ED Triage Notes (Signed)
Pt presents pov to have packing removed after pilonidal cyst removal. Pt denies pain at this time

## 2020-05-22 NOTE — ED Provider Notes (Signed)
[facility] EMERGENCY DEPARTMENT Provider Note   CSN: [id number] Arrival date & time: 05/22/20  1720     History Chief Complaint  Patient presents with  . Packing Removed    Kristina Sampson is a 24 y.o. female who presents to ED for packing removal.  She had a Bartholin duct cyst incision and drainage done on 05/19/2020 with packing placed.  She is here to have packing removed.  Denies any other complaints.  HPI     Past Medical History:  Diagnosis Date  . Medical history non-contributory     Patient Active Problem List   Diagnosis Date Noted  . Status post vacuum-assisted vaginal delivery 01/17/2017  . Pregnancy 01/16/2017    Past Surgical History:  Procedure Laterality Date  . DILATION AND CURETTAGE OF UTERUS       OB History    Gravida  2   Para  1   Term  1   Preterm  0   AB  1   Living  1     SAB  0   TAB  1   Ectopic  0   Multiple  0   Live Births  1           Family History  Problem Relation Age of Onset  . Hypertension Mother   . Depression Sister   . Miscarriages / Stillbirths Sister   . Cancer Maternal Grandfather     Social History   Tobacco Use  . Smoking status: Former Smoker    Quit date: 06/04/2016    Years since quitting: 3.9  . Smokeless tobacco: Former Engineer, water Use Topics  . Alcohol use: No  . Drug use: Yes    Types: Marijuana    Comment: prior to preg    Home Medications Prior to Admission medications   Medication Sig Start Date End Date Taking? Authorizing Provider  acetaminophen (TYLENOL) 325 MG tablet Take 650 mg by mouth every 6 (six) hours as needed for mild pain.    [provider]  clindamycin (CLEOCIN) 300 MG capsule Take 1 capsule (300 mg total) by mouth 4 (four) times daily for 7 days. 05/19/20 05/26/20  Mirian Mo, MD  methocarbamol (ROBAXIN) 500 MG tablet Take 1 tablet (500 mg total) by mouth 2 (two) times daily. 04/26/20   Gailen Shelter, PA  Prenatal Vit-Fe  Fumarate-FA (PRENATAL MULTIVITAMIN) TABS tablet Take 1 tablet by mouth daily at 12 noon.    [provider]    Allergies    Lac bovis and Other  Review of Systems   Review of Systems  Constitutional: Negative for chills and fever.  Gastrointestinal: Negative for abdominal pain.  Skin: Positive for wound.  Neurological: Negative for weakness.    Physical Exam Updated Vital Signs BP 110/70 (BP Location: Right Arm)   Pulse 64   Temp 99 F (37.2 C) (Oral)   Resp 16   Ht 5\' 1"  (1.549 m)   Wt 56.7 kg   LMP 04/26/2020   SpO2 99%   BMI 23.62 kg/m   Physical Exam Vitals and nursing note reviewed. Exam conducted with a chaperone present.  Constitutional:      General: She is not in acute distress.    Appearance: She is well-developed. She is not diaphoretic.  HENT:     Head: Normocephalic and atraumatic.  Eyes:     General: No scleral icterus.    Conjunctiva/sclera: Conjunctivae normal.  Pulmonary:     Effort:  Pulmonary effort is normal. No respiratory distress.  Genitourinary:      Comments: Packing in place with improvement in swelling noted in the right labia compared to prior description. Musculoskeletal:     Cervical back: Normal range of motion.  Skin:    Findings: No rash.  Neurological:     Mental Status: She is alert.     ED Results / Procedures / Treatments   Labs (all labs ordered are listed, but only abnormal results are displayed) Labs Reviewed - No data to display  EKG None  Radiology No results found.  Procedures Procedures (including critical care time)  Medications Ordered in ED Medications - No data to display  ED Course  I have reviewed the triage vital signs and the nursing notes.  Pertinent labs & imaging results that were available during my care of the patient were reviewed by me and considered in my medical decision making (see chart for details).    MDM Rules/Calculators/A&P                          24 year old  female presenting to the ED for packing removal after Bartholin gland cyst I&D done 3 days ago.  Area appears to be draining with packing in place.  Swelling has significantly improved based on prior note.  Patient agrees.  This was removed successfully.  Return precautions given.   Patient is hemodynamically stable, in NAD, and able to ambulate in the ED. Evaluation does not show pathology that would require ongoing emergent intervention or inpatient treatment. I explained the diagnosis to the patient. Pain has been managed and has no complaints prior to discharge. Patient is comfortable with above plan and is stable for discharge at this time. All questions were answered prior to disposition. Strict return precautions for returning to the ED were discussed. Encouraged follow up with PCP.   An After Visit Summary was printed and given to the patient.   Portions of this note were generated with Scientist, clinical (histocompatibility and immunogenetics). Dictation errors may occur despite best attempts at proofreading.  Final Clinical Impression(s) / ED Diagnoses Final diagnoses:  Abscess packing removal    Rx / DC Orders ED Discharge Orders    None       Dietrich Pates, PA-C 05/22/20 2009    Terald Sleeper, MD 05/23/20 1144

## 2020-09-07 ENCOUNTER — Other Ambulatory Visit: Payer: Self-pay | Admitting: Family Medicine

## 2023-07-08 ENCOUNTER — Emergency Department (HOSPITAL_COMMUNITY)
Admission: EM | Admit: 2023-07-08 | Discharge: 2023-07-08 | Disposition: A | Discharge status: Home / Self Care | Payer: Medicaid Other | Attending: Emergency Medicine | Admitting: Emergency Medicine

## 2023-07-08 ENCOUNTER — Other Ambulatory Visit: Payer: Self-pay

## 2023-07-08 ENCOUNTER — Encounter (HOSPITAL_COMMUNITY): Payer: Self-pay | Admitting: *Deleted

## 2023-07-08 DIAGNOSIS — N3090 Cystitis, unspecified without hematuria: Secondary | ICD-10-CM | POA: Insufficient documentation

## 2023-07-08 DIAGNOSIS — R1033 Periumbilical pain: Secondary | ICD-10-CM | POA: Diagnosis present

## 2023-07-08 LAB — URINALYSIS, ROUTINE W REFLEX MICROSCOPIC
Bacteria, UA: NONE SEEN
Bilirubin Urine: NEGATIVE
Glucose, UA: NEGATIVE mg/dL
Ketones, ur: NEGATIVE mg/dL
Nitrite: NEGATIVE
Protein, ur: 30 mg/dL — AB
RBC / HPF: >50 RBC/hpf (ref 0–5)
Specific Gravity, Urine: 1.012 (ref 1.005–1.030)
WBC, UA: >50 WBC/hpf (ref 0–5)
pH: 6 (ref 5.0–8.0)

## 2023-07-08 LAB — PREGNANCY, URINE: Preg Test, Ur: NEGATIVE

## 2023-07-08 MED ORDER — PHENAZOPYRIDINE HCL 100 MG PO TABS
95.0000 mg | ORAL_TABLET | Freq: Once | ORAL | Status: AC
  Administered 2023-07-08: 100 mg via ORAL
  Filled 2023-07-08: qty 1

## 2023-07-08 MED ORDER — NITROFURANTOIN MONOHYD MACRO 100 MG PO CAPS
100.0000 mg | ORAL_CAPSULE | Freq: Once | ORAL | Status: AC
  Administered 2023-07-08: 100 mg via ORAL
  Filled 2023-07-08: qty 1

## 2023-07-08 MED ORDER — PHENAZOPYRIDINE HCL 200 MG PO TABS: 200.0000 mg | ORAL_TABLET | Freq: Three times a day (TID) | ORAL | 0 refills | Status: AC

## 2023-07-08 MED ORDER — NITROFURANTOIN MONOHYD MACRO 100 MG PO CAPS: 100.0000 mg | ORAL_CAPSULE | Freq: Two times a day (BID) | ORAL | 0 refills | Status: AC

## 2023-07-08 NOTE — ED Provider Notes (Signed)
[place] EMERGENCY DEPARTMENT AT [facility] Provider Note   CSN: [id number] Arrival date & time: 07/08/23  0932     History  Chief Complaint  Patient presents with   Abdominal Pain   Vaginal Pain    Kristina Sampson is a 27 y.o. female.  27 year old female here today for suprapubic pressure, urge to urinate.  Symptoms began this morning.  Denies any vaginal bleeding or vaginal discharge.   Abdominal Pain Vaginal Pain Associated symptoms include abdominal pain.       Home Medications Prior to Admission medications   Medication Sig Start Date End Date Taking? Authorizing Provider  nitrofurantoin, macrocrystal-monohydrate, (MACROBID) 100 MG capsule Take 1 capsule (100 mg total) by mouth 2 (two) times daily. 07/08/23  Yes Anders Simmonds T, DO  phenazopyridine (PYRIDIUM) 200 MG tablet Take 1 tablet (200 mg total) by mouth 3 (three) times daily. 07/08/23  Yes Anders Simmonds T, DO  acetaminophen (TYLENOL) 325 MG tablet Take 650 mg by mouth every 6 (six) hours as needed for mild pain.    [provider]  methocarbamol (ROBAXIN) 500 MG tablet Take 1 tablet (500 mg total) by mouth 2 (two) times daily. 04/26/20   Gailen Shelter, PA  Prenatal Vit-Fe Fumarate-FA (PRENATAL MULTIVITAMIN) TABS tablet Take 1 tablet by mouth daily at 12 noon.    [provider]      Allergies    Milk (cow) and Other    Review of Systems   Review of Systems  Gastrointestinal:  Positive for abdominal pain.  Genitourinary:  Positive for vaginal pain.    Physical Exam Updated Vital Signs BP 114/75   Pulse 66   Temp 98.4 F (36.9 C)   Resp 15   Ht 5\' 1"  (1.549 m)   Wt 65.3 kg   SpO2 100%   BMI 27.21 kg/m  Physical Exam Vitals reviewed.  Abdominal:     General: Abdomen is flat.     Palpations: Abdomen is soft.     Tenderness: There is no abdominal tenderness.  Neurological:     Mental Status: She is alert.     ED Results / Procedures / Treatments    Labs (all labs ordered are listed, but only abnormal results are displayed) Labs Reviewed  URINALYSIS, ROUTINE W REFLEX MICROSCOPIC - Abnormal; Notable for the following components:      Result Value   APPearance CLOUDY (*)    Hgb urine dipstick MODERATE (*)    Protein, ur 30 (*)    Leukocytes,Ua LARGE (*)    All other components within normal limits  PREGNANCY, URINE    EKG None  Radiology No results found.  Procedures Procedures    Medications Ordered in ED Medications  nitrofurantoin (macrocrystal-monohydrate) (MACROBID) capsule 100 mg (has no administration in time range)  phenazopyridine (PYRIDIUM) tablet 100 mg (has no administration in time range)    ED Course/ Medical Decision Making/ A&P                                 Medical Decision Making 27 year old female here today with suprapubic pressure and urge to urinate.  Differential diagnoses include cystitis, less likely STI, less likely ectopic, less likely intra-abdominal infection.  Plan- patient symptoms are most consistent with a cystitis.  Will check urinalysis.  No systemic signs of infection.  No back pain.  Reassessment 10:20 AM-urine positive for cystitis.  Will discharge.  Amount  and/or Complexity of Data Reviewed Labs: ordered.  Risk Prescription drug management.           Final Clinical Impression(s) / ED Diagnoses Final diagnoses:  Cystitis    Rx / DC Orders ED Discharge Orders          Ordered    nitrofurantoin, macrocrystal-monohydrate, (MACROBID) 100 MG capsule  2 times daily        07/08/23 1019    phenazopyridine (PYRIDIUM) 200 MG tablet  3 times daily        07/08/23 1019              Anders Simmonds T, DO 07/08/23 1020

## 2023-07-08 NOTE — ED Notes (Signed)
This RN reviewed discharge instructions with patient. She verbalized understanding and denied any further questions. PT well appearing upon discharge and denies pain. Pt ambulated with stable gait to exit. Pt endorses ride home.

## 2023-07-08 NOTE — Discharge Instructions (Addendum)
You can take Macrobid 3 times per day for the next 5 days.  You can also take Pyridium, 3 times per day for the next few days.  Macrobid is the antibiotic, Pyridium can help out with some of the discomfort.  You should begin to feel better in 24 to 48 hours.  Return to the emergency department if develop fever, feel as though you are not improving after 48 hours.

## 2023-07-08 NOTE — ED Triage Notes (Signed)
C/o discomfort in her lower abd. And vaginal area onset this am, states she feels like she needs to urinate however can't , c/o pressure. Denies vaginal bleeding, LMP 1 week ago

## 2023-10-10 ENCOUNTER — Other Ambulatory Visit: Payer: Self-pay

## 2023-10-10 ENCOUNTER — Emergency Department (HOSPITAL_COMMUNITY)
Admission: EM | Admit: 2023-10-10 | Discharge: 2023-10-10 | Disposition: A | Discharge status: Home / Self Care | Attending: Emergency Medicine | Admitting: Emergency Medicine

## 2023-10-10 DIAGNOSIS — N751 Abscess of Bartholin's gland: Secondary | ICD-10-CM | POA: Insufficient documentation

## 2023-10-10 DIAGNOSIS — Z87891 Personal history of nicotine dependence: Secondary | ICD-10-CM | POA: Diagnosis not present

## 2023-10-10 MED ORDER — LIDOCAINE HCL (PF) 1 % IJ SOLN
20.0000 mL | Freq: Once | INTRAMUSCULAR | Status: AC
  Administered 2023-10-10: 20 mL
  Filled 2023-10-10: qty 20

## 2023-10-10 MED ORDER — OXYCODONE-ACETAMINOPHEN 5-325 MG PO TABS
1.0000 | ORAL_TABLET | Freq: Once | ORAL | Status: AC
  Administered 2023-10-10: 1 via ORAL
  Filled 2023-10-10: qty 1

## 2023-10-10 MED ORDER — MIDAZOLAM HCL 2 MG/2ML IJ SOLN
2.0000 mg | Freq: Once | INTRAMUSCULAR | Status: AC
  Administered 2023-10-10: 2 mg via INTRAVENOUS
  Filled 2023-10-10: qty 2

## 2023-10-10 MED ORDER — FENTANYL CITRATE PF 50 MCG/ML IJ SOSY
50.0000 ug | PREFILLED_SYRINGE | Freq: Once | INTRAMUSCULAR | Status: AC
  Administered 2023-10-10: 50 ug via INTRAVENOUS
  Filled 2023-10-10: qty 1

## 2023-10-10 MED ORDER — SULFAMETHOXAZOLE-TRIMETHOPRIM 800-160 MG PO TABS: 1.0000 | ORAL_TABLET | Freq: Two times a day (BID) | ORAL | 0 refills | Status: AC

## 2023-10-10 MED ORDER — AMOXICILLIN-POT CLAVULANATE 875-125 MG PO TABS: 1.0000 | ORAL_TABLET | Freq: Two times a day (BID) | ORAL | 0 refills | Status: AC

## 2023-10-10 NOTE — Discharge Instructions (Addendum)
 Kristina Sampson:  Thank you for allowing [place] to take care of you today.  We hope you begin feeling better soon.  To-Do: Please follow-up with your primary doctor to discuss any findings from today's emergency department visit. Please return to the Emergency Department or call 911 if you experience chest pain, shortness of breath, severe pain, severe fever, altered mental status, or have any reason to think that you need emergency medical care.  Thank you again.  Hope you feel better soon.  Department of Emergency Medicine [facility]

## 2023-10-10 NOTE — ED Provider Notes (Signed)
 [place] EMERGENCY DEPARTMENT AT [facility] Provider Note  Arrival date/time:10/10/2023 3:33 PM  HPI/ROS   Kristina Sampson is a 28 y.o. female who is presenting due to concern for Bartholin gland abscess History is provided by patient.  Patient says that she has had a partial gland abscess growing in her private parts for the past 3 days.  She noticed it on Monday and has been trying home remedies such as warm compresses and drainage salve, however has been unsuccessful.  She is now started feeling fevers and chills intermittently at home as well. She has had Bartholin's glands abscesses in the past 3 times.  The last time, she endorses it was very uncomfortable for her to have it drained she is very nervous to come in to have it drained again, however she was unsuccessful in managing with home remedies.  A complete ROS was performed with pertinent positives/negatives noted above.   ED Course and Medical Decision Making   I personally reviewed the patient's vitals.   Assessment/Plan: This is a 28 year old patient with history of breast lymph gland abscess that is presenting with a Bartholin gland abscess on her right labia.  Incision and drainage was performed per procedure note below  Given recurrent Bartholin's abscesses, as well as reported fevers and chills, will treat with antibiotics.  Sent prescription for Bactrim and Augmentin to patient's preferred pharmacy.  Discussed discharge and return precautions as well as home care.  Disposition:  I discussed the plan for discharge with the patient and/or their surrogate at bedside prior to discharge and they were in agreement with the plan and verbalized understanding of the return precautions provided. All questions answered to the best of my ability. Ultimately, the patient was discharged in stable condition with stable vital signs. I am reassured that they are capable of close follow up and good social support at home.    Clinical Impression:  1. Abscess of Bartholin gland     Rx / DC Orders ED Discharge Orders     None       The plan for this patient was discussed with Dr. Jeraldine Loots, who voiced agreement and who oversaw evaluation and treatment of this patient.   Clinical Complexity A medically appropriate history, review of systems, and physical exam was performed.  Patient's presentation is most consistent with acute complicated illness / injury requiring diagnostic workup.  Medical Decision Making Risk Prescription drug management.    Physical Exam and Medical History   Vitals:   10/10/23 1400 10/10/23 1415 10/10/23 1444 10/10/23 1451  BP: 109/84  109/84 125/80  Pulse:  71 88 66  Resp:   16 16  Temp:   98.1 F (36.7 C)   SpO2:  100% 100% 100%  Weight:      Height:        Physical Exam Vitals and nursing note reviewed.  Constitutional:      General: She is not in acute distress.    Appearance: She is well-developed.  HENT:     Head: Normocephalic and atraumatic.  Eyes:     Conjunctiva/sclera: Conjunctivae normal.  Cardiovascular:     Rate and Rhythm: Normal rate.  Abdominal:     Palpations: Abdomen is soft.     Tenderness: There is no abdominal tenderness.  Genitourinary:    Comments: Bartholin's gland abscess to right labia Musculoskeletal:     Cervical back: Neck supple.  Skin:    General: Skin is warm and dry.  Capillary Refill: Capillary refill takes less than 2 seconds.  Neurological:     Mental Status: She is alert.  Psychiatric:        Mood and Affect: Mood normal.     Medical History: Allergies  Allergen Reactions   Milk (Cow)     Milk Other reaction(s): Angioedema Itchy throat   Other     Kiwi Other reaction(s): Angioedema   Past Medical History:  Diagnosis Date   Medical history non-contributory     Past Surgical History:  Procedure Laterality Date   DILATION AND CURETTAGE OF UTERUS     Family History  Problem Relation Age of  Onset   Hypertension Mother    Depression Sister    Miscarriages / Stillbirths Sister    Cancer Maternal Grandfather     Social History   Tobacco Use   Smoking status: Former    Current packs/day: 0.00    Types: Cigarettes    Quit date: 06/04/2016    Years since quitting: 7.3   Smokeless tobacco: Former  Substance Use Topics   Alcohol use: No   Drug use: Yes    Types: Marijuana    Comment: prior to preg    Procedures   If procedures were preformed on this patient, they are listed below:  .Incision and Drainage  Date/Time: 10/10/2023 5:20 PM  Performed by: Caron Presume, MD Authorized by: Gerhard Munch, MD   Consent:    Consent obtained:  Verbal   Consent given by:  Patient   Risks discussed:  Bleeding, incomplete drainage and pain Universal protocol:    Patient identity confirmed:  Verbally with patient and arm band Location:    Type:  Bartholin cyst   Size:  5cmx5cm   Location:  Anogenital   Anogenital location:  Bartholin's gland Pre-procedure details:    Skin preparation:  Povidone-iodine Sedation:    Sedation type:  None Procedure type:    Complexity:  Simple Procedure details:    Incision types:  Stab incision   Drainage:  Purulent   Drainage amount:  Copious   Wound treatment:  Drain placed   Packing materials:  Word catheter Post-procedure details:    Procedure completion:  Tolerated well, no immediate complications    -------- HPI and MDM generated using voice dictation software and may contain dictation errors. Please contact me for any clarification or with any questions.   Cephus Slater, MD Emergency Medicine PGY-2    Caron Presume, MD 10/10/23 1721    Gerhard Munch, MD 10/10/23 2123

## 2023-10-10 NOTE — ED Triage Notes (Signed)
 Pt. Stated, I have a cyst in my private area since Tuesday.
